# Patient Record
Sex: Male | Born: 1945 | Race: White | Hispanic: No | Marital: Married | Smoking: Former smoker
Health system: Western US, Academic
[De-identification: ages and names within clinical notes are randomized; demographics above are authoritative.]

---

## 2005-12-10 HISTORY — PX: ARTHROPLASTY, KNEE, TOTAL: SHX000058

## 2011-12-11 HISTORY — PX: REPAIR, HERNIA, OPEN: SHX001552

## 2012-12-10 HISTORY — PX: GASTRECTOMY, SLEEVE, LAPAROSCOPIC: SHX000773

## 2014-10-18 ENCOUNTER — Ambulatory Visit: Admission: RE | Admit: 2014-10-18 | Payer: Self-pay | Source: Ambulatory Visit

## 2014-11-02 ENCOUNTER — Inpatient Hospital Stay: Admit: 2014-11-02 | Discharge: 2014-11-02 | Disposition: A | Discharge status: Home / Self Care | Payer: Self-pay

## 2014-11-08 ENCOUNTER — Telehealth: Payer: Self-pay

## 2014-11-08 ENCOUNTER — Encounter: Payer: Self-pay | Admitting: Surgical Oncology

## 2014-11-08 NOTE — Telephone Encounter (Signed)
-----   Message from Dannette Barbara, MD sent at 11/08/2014 12:57 PM PST -----  Regarding: RE: patient you want in 12/7 but I need to know what time  12/7 at 3 pm, ok to overbook.    Thank you very much.      Crist Infante, MD  Associate Professor  Surgical Oncology    ----- Message -----     From: Carmelia Bake     Sent: 11/08/2014  11:05 AM       To: Dannette Barbara, MD  Subject: patient you want in 12/7 but I need to know #    Hi Dr. [name]     This is the patient doing scan 12/5 that you want in 12/7, Leiomyosarcoma L lower extremity.  (see Outlook messaging)    Your clinic is full 12/7, what time do you want to schedule him in?    Lennette Bihari

## 2014-11-10 ENCOUNTER — Encounter: Payer: Self-pay | Admitting: Surgical Oncology

## 2014-11-11 ENCOUNTER — Other Ambulatory Visit: Payer: Self-pay

## 2014-11-11 DIAGNOSIS — E079 Disorder of thyroid, unspecified: Secondary | ICD-10-CM | POA: Insufficient documentation

## 2014-11-11 DIAGNOSIS — Z9884 Bariatric surgery status: Secondary | ICD-10-CM | POA: Insufficient documentation

## 2014-11-11 DIAGNOSIS — Z809 Family history of malignant neoplasm, unspecified: Secondary | ICD-10-CM | POA: Insufficient documentation

## 2014-11-11 DIAGNOSIS — D492 Neoplasm of unspecified behavior of bone, soft tissue, and skin: Secondary | ICD-10-CM | POA: Insufficient documentation

## 2014-11-11 DIAGNOSIS — Z86711 Personal history of pulmonary embolism: Secondary | ICD-10-CM | POA: Insufficient documentation

## 2014-11-11 DIAGNOSIS — E118 Type 2 diabetes mellitus with unspecified complications: Secondary | ICD-10-CM | POA: Insufficient documentation

## 2014-11-11 DIAGNOSIS — Z96659 Presence of unspecified artificial knee joint: Secondary | ICD-10-CM | POA: Insufficient documentation

## 2014-11-11 DIAGNOSIS — Z9889 Other specified postprocedural states: Secondary | ICD-10-CM | POA: Insufficient documentation

## 2014-11-11 DIAGNOSIS — C499 Malignant neoplasm of connective and soft tissue, unspecified: Secondary | ICD-10-CM

## 2014-11-11 DIAGNOSIS — Z87448 Personal history of other diseases of urinary system: Secondary | ICD-10-CM | POA: Insufficient documentation

## 2014-11-11 DIAGNOSIS — Z8719 Personal history of other diseases of the digestive system: Secondary | ICD-10-CM | POA: Insufficient documentation

## 2014-11-11 DIAGNOSIS — R2240 Localized swelling, mass and lump, unspecified lower limb: Secondary | ICD-10-CM | POA: Insufficient documentation

## 2014-11-11 DIAGNOSIS — E782 Mixed hyperlipidemia: Secondary | ICD-10-CM | POA: Insufficient documentation

## 2014-11-11 DIAGNOSIS — R2242 Localized swelling, mass and lump, left lower limb: Secondary | ICD-10-CM

## 2014-11-11 NOTE — Progress Notes (Signed)
Order signed. Thank you very much.      Dayn Wade Sigala, MD  Associate Professor  Surgical Oncology

## 2014-11-13 ENCOUNTER — Inpatient Hospital Stay: Admit: 2014-11-13 | Discharge: 2014-11-13 | Disposition: A | Discharge status: Home / Self Care | Payer: Self-pay

## 2014-11-15 ENCOUNTER — Ambulatory Visit: Payer: Medicare Other

## 2014-11-15 ENCOUNTER — Other Ambulatory Visit: Payer: Self-pay

## 2014-11-15 VITALS — BP 145/80 | HR 70 | Temp 98.2°F | Resp 16 | Ht 68.5 in | Wt 288.0 lb

## 2014-11-15 DIAGNOSIS — Z9889 Other specified postprocedural states: Principal | ICD-10-CM | POA: Insufficient documentation

## 2014-11-15 DIAGNOSIS — M1990 Unspecified osteoarthritis, unspecified site: Secondary | ICD-10-CM

## 2014-11-15 DIAGNOSIS — C4922 Malignant neoplasm of connective and soft tissue of left lower limb, including hip: Secondary | ICD-10-CM | POA: Insufficient documentation

## 2014-11-15 DIAGNOSIS — C499 Malignant neoplasm of connective and soft tissue, unspecified: Secondary | ICD-10-CM

## 2014-11-15 LAB — CBC WITH MANUAL DIFFERENTIAL
BANDS %: 1 %
BASOPHILS %: 2 %
BASOPHILS ABS: 0.12 10*3/uL (ref 0.0–0.5)
EOSINOPHILS %: 2 %
EOSINOPHILS ABS: 0.12 10*3/uL (ref 0.1–0.3)
HEMATOCRIT: 36.8 % — AB (ref 41–53)
HEMOGLOBIN: 12.5 g/dL — AB (ref 13.5–17.5)
LYMPHOCYTES ABS: 2.23 10*3/uL (ref 1.0–4.8)
LYMPHS %: 36 %
MCH: 31.5 pg (ref 27–33)
MCHC: 33.9 % (ref 32–36)
MCV: 92.8 UM3 (ref 80–100)
MONOCYTES %: 5 %
MONOCYTES ABS: 0.31 10*3/uL (ref 0.2–0.4)
MPV: 6.7 UM3 — AB (ref 6.8–10.0)
NEUTROPHIL ABS: 3.41 10*3/uL (ref 1.80–7.70)
PLATELET COUNT: 240 10*3/uL (ref 130–400)
PLATELET ESTIMATE, SMEAR: NORMAL
POLYS (SEGS)%: 54 %
RDW: 14 U (ref 0–14.7)
RED CELL COUNT: 3.97 10*6/uL — AB (ref 4.5–5.9)
SAMPLE SIZE, WBC: 100
WHITE BLOOD CELL COUNT: 6.2 10*3/uL (ref 4.5–11.0)

## 2014-11-15 LAB — LACTATE DEHYDROGENASE: LACTATE DEHYDROGENASE: 120 U/L (ref 90–200)

## 2014-11-15 LAB — COMPREHENSIVE METABOLIC PANEL
ALANINE TRANSFERASE (ALT): 25 U/L (ref 6–63)
ALBUMIN: 4.2 g/dL (ref 3.2–4.6)
ALKALINE PHOSPHATASE (ALP): 46 U/L (ref 35–115)
ASPARTATE TRANSAMINASE (AST): 23 U/L (ref 15–43)
BILIRUBIN TOTAL: 0.7 mg/dL (ref 0.3–1.3)
CALCIUM: 9.5 mg/dL (ref 8.6–10.5)
CARBON DIOXIDE TOTAL: 25 meq/L (ref 24–32)
CHLORIDE: 108 meq/L (ref 95–110)
CREATININE BLOOD: 0.85 mg/dL (ref 0.44–1.27)
GLUCOSE: 94 mg/dL (ref 70–99)
POTASSIUM: 4.3 meq/L (ref 3.3–5.0)
PROTEIN: 6.8 g/dL (ref 6.3–8.3)
SODIUM: 141 meq/L (ref 135–145)
UREA NITROGEN, BLOOD (BUN): 13 mg/dL (ref 8–22)

## 2014-11-15 LAB — INR: INR: 0.94 (ref 0.87–1.18)

## 2014-11-15 LAB — C-REACTIVE PROTEIN

## 2014-11-15 NOTE — Nursing Note (Signed)
Vitals signs taken. Screen for pain. Pharmacy and allergies verified. Alveena Taira, MA

## 2014-11-16 ENCOUNTER — Encounter: Payer: Self-pay | Admitting: Surgical Oncology

## 2014-11-16 LAB — TYPE AND SCREEN
ANTIBODY SCREEN (ORTHO GEL): NEGATIVE
PATIENT BLOOD TYPE: O NEG

## 2014-11-16 NOTE — Progress Notes (Signed)
SURGICAL ONCOLOGY CLINIC NOTE    Dear Dr. 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    The patient is a delightful 43yr male with a history of a soft tissue tumor in the left lateral aspect of his knee for several years.The patient recently became concerned about interval tumor growth.The patient began to experience symptoms related to the mass effect.These symptoms included pain with contact. The patient presented to his physicians for further evaluation. The indeterminate nature of the soft tissue tumor was confirmed. An ultrasound confirmed a solid mass. A percutaneous biopsy was performed. This was interpreted as leiomyosarcoma. I am seeing the patient for additional treatment recommendations.    Clinically, the patient has symptoms as noted above. The patient is otherwise essentially asymptomatic.The patient denies fevers, chills or sweats.The patient has a history significant for weight loss following bariatric surgery, but his recent medical history does not include unintentional weight loss.The patient deniesother soft tissue masses on her body.There is no history ofexposure to radiation. The patient denies a significant history of trauma to the region. The patient has a significant history of degenerative joint disease, including right knee knee replacement, but his left knee is relatively asymptomatic.    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    History of pulmonary embolism    History of renal failure    Knee joint replacement status    Soft tissue tumor    History of gastric  restrictive surgery    H/O umbilical hernia repair    Family history of cancer    Thyroid dysfunction    Mixed hyperlipidemia    Type 2 diabetes mellitus with complication    History of knee surgery    Arthritis, degenerative     PAST SURGICAL HISTORY:   Past Surgical History   Procedure Laterality Date    Repair hernia (open)  2013    Gastrectomy sleeve laparoscopic  2014    Arthroplasty knee total Right 2007     MEDICATIONS: aspirin, B12 (post bariatric surgery), synthroid, and prilosec.    He was informed that the standard recommendation is to hold aspirin and other NSAIDs for 7 - 10 days before a surgical procedure because of the increased risk of bleeding when taking this medication.     ALLERGIES:No Known Allergies    SOCIAL HISTORY:   History     Social History    Marital Status: N/A     Spouse Name: N/A     Number of Children: N/A    Years of Education: N/A     Social History Main Topics    Smoking status: Former Smoker -- 1.00 packs/day for 10 years     Types: Cigarettes    Smokeless tobacco: Never Used      Comment: approx 10 pack years, quit 30 years ago    Alcohol Use: 0.0 - 0.6 oz/week     0-1 Not specified per week      Comment: occasional    Drug Use: Not on file    Sexual Activity: Not on file  Other Topics Concern    Not on file     Social History Narrative     FAMILY HISTORY:  Family History   Problem Relation Age of Onset    throat cancer [Other] [OTHER] Father      9s, died of disease     REVIEW OF SYSTEMS: A detailed review of systems was performed.   Constitutional is negative for fevers, chills or sweats. Weight loss x 110 lbs 2/2 bariatric surgery. Recent weight stable to increased.   GI is negative for nausea or vomiting or abdominal pain.  Heme is negative for anemia.  Cardiac is negative for chest pain.  Respiratory is negative for shortness of breath.  GU is positive nocturia. Negative for dysuria or UTI.  Musculoskeletal is as per HPI.   Neurologic is  negative for headaches and dizziness.   Endocrine is prior history of diabetes, off medications since bariatric operation.  Derm is negative forrash.   Psych is negative for depression and anxiety.    On examination, he is no acute distress.   Temp: 36.8 C (98.2 F) (12/07 1429)  Temp src: Oral (12/07 1429)  Pulse: 70 (12/07 1429)  BP: 145/80 mmHg (12/07 1429)  Resp: 16 (12/07 1429)  SpO2: 97 % (12/07 1429)  Height: 174 cm (5' 8.5") (12/07 1429)  Weight: 130.636 kg (288 lb) (12/07 1429)  Body mass index is 43.15 kg/(m^2).  Head/Face negative icterus, mucous membranes moist.  Neck negative adenopathy, trachea midline.  Chest breasts mild gynecomastia, negative masses. Axilla no adenopathy bilaterally.  Abdomen obese with large pannus inferior abdomen and groins. Healed post-surgical changes. No masses. No tenderness to palpation.  Groins large pannus, but negative masses, negative adenopathy.  Extremities warm without edema. He moves all 4 extremities with good range of motion.  Back negative masses, negative rashes.  Neuro non-focal.  Psych alert and oriented x 3.    A more detailed examination of the left lower extremity was performed. DP pulse is 2+. Plantar/dorsi flexion intact with full range of motion and intact strength. In the left lateral knee subcutaneous tissue, a palpable mass is present. Clinically, it measures approximately 5 cm CC x 3 - 4 cc ML. The mass is firm, but relatively mobile to the deeper tissues. There is mass effect on the skin with teleangiectatic changes and lack of skin mobility relative to the mass. There is no ulceration.    DATA REVIEW: 1) MRI with gadolinium was performed on 11/13/14. I personally reviewed images. A 4 x 3 cm T1 hypointense soft tissue tumor is identified in the superficial tissues of the lateral knee overlying the fascia. The mass is T2 hyperintense with heterogeneous components, and the mass is uniformly enhancing following gadolinium administration.    2)  ultrasound-guided biopsy was performed on 10/21/14. Outside pathology results are consistent with leiomyosarcoma. Pathology slides have been requested at [facility] for review here.    3) CT chest was performed on 11/02/14. I personally reviewed images. There is no evidence of metastases. Chronic 2 - 3 mm pulmonary nodules are present x approximately 3 years which are consistent with benign nodules.    IMPRESSION AND RECOMMENDATIONS:I had a long discussion with the patient and his wife regarding my impression and recommendations.I reviewed the clinical, radiographic, and pathologic findings.Iindicated he is alive with disease.The patient's KPS is approximately 100%.    I indicated that the clinical and cross-sectional imaging findings are consistent with localized leiomyosarcoma of the subcutaneous space. I discussed in the  abstract the multimodality nature of the treatment ofsoft tissue sarcoma.I indicated that complete resection with negative margins is the cornerstone of therapy. I indicated that radiotherapy is frequently administered as part of a combined modality therapy approach, although for some low grade soft tissue sarcoma that are amenable to wide resection, surgical resection may be definitive management as monotherapy. However, this would depend on the absence of possible metastatic disease and establishing a definitive diagnosis. I indicated that in the setting of a known diagnosis of soft tissue sarcoma, complete staging studies are indicated to rule out metastatic disease. In the setting of metastases, then the dominant treatment modality is systemic therapy, and surgical resection is frequently contraindicated.    I indicated that the predominant site of metastases with extremity soft tissue sarcoma is pulmonary metastases via hematogenous dissemination. The negative Chest CT is therefore reassuring that we are dealing with localized disease. I indicated that for localized disease, surgical  resection is potentially curative in nature. In fact, surgical resection is the only potentially curative therapy, and complete resection is clearly associated with superior oncologic outcome.    I described the rationale for wide excision. For known malignant softtissue masses such as sarcomas, a more radical resection is performed, including removal of a margin of normal tissue to ensure a widely clearresection.This is intended to maximize oncologic outcome but does increase the potential for post-surgical morbidity and may impact the long term functional results.     I discussed the risks, benefits, and some of the technical aspects of radical resection/ wide excision.I indicated that the procedure is associated with superior oncologic outcome. It is the only potentially curative approach.I discussed the potential complications of the procedure.These include bleeding, infection, and need for further surgery.There is a remote risk of damage toadjacent structures such as the neurovascular bundle.Although rare,these possible complications could have substantial acute and chronic morbidity in theunlikely event that they should occur.    I indicated that given the superficial nature of the leiomyosarcoma with changes consistent with possible skin involvement, that the surgical approach will include a skin paddle overlying the tumor as a margin. This will maximize oncologic outcome, but may lead to an indication for skin graft for soft tissue reconstruction. We will attempt to mobilize skin flaps for primary closure, but if there are concerns regarding possible tension on the flaps and/or poor wound healing, a skin graft may be indicated to maximize healing and definitive closure of the wound. I explained the risk of hematoma, seroma, or abscess after the operation. I discussed with him the risks of skin necrosis, wound breakdown, and the remote risk of damage to adjacent structures. I indicated that  additional treatment recommendations will depend on the final pathology.    We will proceed with routine preoperative testing, including blood work. His CT chest is negative, so a chest xray is not indicated.He will return for a formal preoperative visit and signing of the informed consent.We have set a tentative date for the operation in the next several weeks. As noted above, he was informed that the standard recommendation is to hold aspirin and other NSAIDs for 7 - 10 days before a surgical procedure because of the increased risk of bleeding when taking this medication.     The patient is comfortable with these recommendations.He is eager to proceed. He knows to contact my office should additionalquestions arise.    Once again, thank you for the kind referral of this delightful patient. Please do not hesitate to contact  me yourself should yourequire additional information.      Crist Infante, MD  Associate Professor  Surgical Oncology

## 2014-11-17 ENCOUNTER — Ambulatory Visit: Payer: Medicare Other

## 2014-11-17 DIAGNOSIS — C4922 Malignant neoplasm of connective and soft tissue of left lower limb, including hip: Secondary | ICD-10-CM

## 2014-11-17 DIAGNOSIS — C499 Malignant neoplasm of connective and soft tissue, unspecified: Principal | ICD-10-CM

## 2014-11-18 ENCOUNTER — Encounter: Payer: Self-pay | Admitting: Surgical Oncology

## 2014-11-18 LAB — INTERLEUKIN 6

## 2014-11-18 LAB — TUMOR NECROSIS FACTOR-ALPHA

## 2014-11-21 ENCOUNTER — Telehealth: Payer: Self-pay

## 2014-11-21 NOTE — Communication Body (Signed)
SURGICAL ONCOLOGY CLINIC NOTE    Dear Dr. 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    The patient is a delightful 74yr male with a history of a soft tissue tumor in the left lateral aspect of his knee for several years.The patient recently became concerned about interval tumor growth.The patient began to experience symptoms related to the mass effect.These symptoms included pain with contact. The patient presented to his physicians for further evaluation. The indeterminate nature of the soft tissue tumor was confirmed. An ultrasound confirmed a solid mass. A percutaneous biopsy was performed. This was interpreted as leiomyosarcoma. I am seeing the patient for additional treatment recommendations.    Clinically, the patient has symptoms as noted above. The patient is otherwise essentially asymptomatic.The patient denies fevers, chills or sweats.The patient has a history significant for weight loss following bariatric surgery, but his recent medical history does not include unintentional weight loss.The patient deniesother soft tissue masses on her body.There is no history ofexposure to radiation. The patient denies a significant history of trauma to the region. The patient has a significant history of degenerative joint disease, including right knee knee replacement, but his left knee is relatively asymptomatic.    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    History of pulmonary embolism    History of renal failure    Knee joint replacement status    Soft tissue tumor    History of gastric  restrictive surgery    H/O umbilical hernia repair    Family history of cancer    Thyroid dysfunction    Mixed hyperlipidemia    Type 2 diabetes mellitus with complication    History of knee surgery    Arthritis, degenerative     PAST SURGICAL HISTORY:   Past Surgical History   Procedure Laterality Date    Repair hernia (open)  2013    Gastrectomy sleeve laparoscopic  2014    Arthroplasty knee total Right 2007     MEDICATIONS: aspirin, B12 (post bariatric surgery), synthroid, and prilosec.    He was informed that the standard recommendation is to hold aspirin and other NSAIDs for 7 - 10 days before a surgical procedure because of the increased risk of bleeding when taking this medication.     ALLERGIES:No Known Allergies    SOCIAL HISTORY:   History     Social History    Marital Status: N/A     Spouse Name: N/A     Number of Children: N/A    Years of Education: N/A     Social History Main Topics    Smoking status: Former Smoker -- 1.00 packs/day for 10 years     Types: Cigarettes    Smokeless tobacco: Never Used      Comment: approx 10 pack years, quit 30 years ago    Alcohol Use: 0.0 - 0.6 oz/week     0-1 Not specified per week      Comment: occasional    Drug Use: Not on file    Sexual Activity: Not on file  Other Topics Concern    Not on file     Social History Narrative     FAMILY HISTORY:  Family History   Problem Relation Age of Onset    throat cancer [Other] [OTHER] Father      54s, died of disease     REVIEW OF SYSTEMS: A detailed review of systems was performed.   Constitutional is negative for fevers, chills or sweats. Weight loss x 110 lbs 2/2 bariatric surgery. Recent weight stable to increased.   GI is negative for nausea or vomiting or abdominal pain.  Heme is negative for anemia.  Cardiac is negative for chest pain.  Respiratory is negative for shortness of breath.  GU is positive nocturia. Negative for dysuria or UTI.  Musculoskeletal is as per HPI.   Neurologic is  negative for headaches and dizziness.   Endocrine is prior history of diabetes, off medications since bariatric operation.  Derm is negative forrash.   Psych is negative for depression and anxiety.    On examination, he is no acute distress.   Temp: 36.8 C (98.2 F) (12/07 1429)  Temp src: Oral (12/07 1429)  Pulse: 70 (12/07 1429)  BP: 145/80 mmHg (12/07 1429)  Resp: 16 (12/07 1429)  SpO2: 97 % (12/07 1429)  Height: 174 cm (5' 8.5") (12/07 1429)  Weight: 130.636 kg (288 lb) (12/07 1429)  Body mass index is 43.15 kg/(m^2).  Head/Face negative icterus, mucous membranes moist.  Neck negative adenopathy, trachea midline.  Chest breasts mild gynecomastia, negative masses. Axilla no adenopathy bilaterally.  Abdomen obese with large pannus inferior abdomen and groins. Healed post-surgical changes. No masses. No tenderness to palpation.  Groins large pannus, but negative masses, negative adenopathy.  Extremities warm without edema. He moves all 4 extremities with good range of motion.  Back negative masses, negative rashes.  Neuro non-focal.  Psych alert and oriented x 3.    A more detailed examination of the left lower extremity was performed. DP pulse is 2+. Plantar/dorsi flexion intact with full range of motion and intact strength. In the left lateral knee subcutaneous tissue, a palpable mass is present. Clinically, it measures approximately 5 cm CC x 3 - 4 cc ML. The mass is firm, but relatively mobile to the deeper tissues. There is mass effect on the skin with teleangiectatic changes and lack of skin mobility relative to the mass. There is no ulceration.    DATA REVIEW: 1) MRI with gadolinium was performed on 11/13/14. I personally reviewed images. A 4 x 3 cm T1 hypointense soft tissue tumor is identified in the superficial tissues of the lateral knee overlying the fascia. The mass is T2 hyperintense with heterogeneous components, and the mass is uniformly enhancing following gadolinium administration.    2)  ultrasound-guided biopsy was performed on 10/21/14. Outside pathology results are consistent with leiomyosarcoma. Pathology slides have been requested at [facility] for review here.    3) CT chest was performed on 11/02/14. I personally reviewed images. There is no evidence of metastases. Chronic 2 - 3 mm pulmonary nodules are present x approximately 3 years which are consistent with benign nodules.    IMPRESSION AND RECOMMENDATIONS:I had a long discussion with the patient and his wife regarding my impression and recommendations.I reviewed the clinical, radiographic, and pathologic findings.Iindicated he is alive with disease.The patient's KPS is approximately 100%.    I indicated that the clinical and cross-sectional imaging findings are consistent with localized leiomyosarcoma of the subcutaneous space. I discussed in the  abstract the multimodality nature of the treatment ofsoft tissue sarcoma.I indicated that complete resection with negative margins is the cornerstone of therapy. I indicated that radiotherapy is frequently administered as part of a combined modality therapy approach, although for some low grade soft tissue sarcoma that are amenable to wide resection, surgical resection may be definitive management as monotherapy. However, this would depend on the absence of possible metastatic disease and establishing a definitive diagnosis. I indicated that in the setting of a known diagnosis of soft tissue sarcoma, complete staging studies are indicated to rule out metastatic disease. In the setting of metastases, then the dominant treatment modality is systemic therapy, and surgical resection is frequently contraindicated.    I indicated that the predominant site of metastases with extremity soft tissue sarcoma is pulmonary metastases via hematogenous dissemination. The negative Chest CT is therefore reassuring that we are dealing with localized disease. I indicated that for localized disease, surgical  resection is potentially curative in nature. In fact, surgical resection is the only potentially curative therapy, and complete resection is clearly associated with superior oncologic outcome.    I described the rationale for wide excision. For known malignant softtissue masses such as sarcomas, a more radical resection is performed, including removal of a margin of normal tissue to ensure a widely clearresection.This is intended to maximize oncologic outcome but does increase the potential for post-surgical morbidity and may impact the long term functional results.     I discussed the risks, benefits, and some of the technical aspects of radical resection/ wide excision.I indicated that the procedure is associated with superior oncologic outcome. It is the only potentially curative approach.I discussed the potential complications of the procedure.These include bleeding, infection, and need for further surgery.There is a remote risk of damage toadjacent structures such as the neurovascular bundle.Although rare,these possible complications could have substantial acute and chronic morbidity in theunlikely event that they should occur.    I indicated that given the superficial nature of the leiomyosarcoma with changes consistent with possible skin involvement, that the surgical approach will include a skin paddle overlying the tumor as a margin. This will maximize oncologic outcome, but may lead to an indication for skin graft for soft tissue reconstruction. We will attempt to mobilize skin flaps for primary closure, but if there are concerns regarding possible tension on the flaps and/or poor wound healing, a skin graft may be indicated to maximize healing and definitive closure of the wound. I explained the risk of hematoma, seroma, or abscess after the operation. I discussed with him the risks of skin necrosis, wound breakdown, and the remote risk of damage to adjacent structures. I indicated that  additional treatment recommendations will depend on the final pathology.    We will proceed with routine preoperative testing, including blood work. His CT chest is negative, so a chest xray is not indicated.He will return for a formal preoperative visit and signing of the informed consent.We have set a tentative date for the operation in the next several weeks. As noted above, he was informed that the standard recommendation is to hold aspirin and other NSAIDs for 7 - 10 days before a surgical procedure because of the increased risk of bleeding when taking this medication.     The patient is comfortable with these recommendations.He is eager to proceed. He knows to contact my office should additionalquestions arise.    Once again, thank you for the kind referral of this delightful patient. Please do not hesitate to contact  me yourself should yourequire additional information.      Crist Infante, MD  Associate Professor  Surgical Oncology

## 2014-11-21 NOTE — Telephone Encounter (Signed)
-----   Message from Dannette Barbara, MD sent at 11/21/2014  5:49 PM PST -----  Regarding: outside slides  Hi Zoe,    Any update on outside slides for this patient? I have not seen anything in the EMR. Thank you very much.    Mikki Santee

## 2014-11-22 LAB — OUTSIDE PATHOLOGY FOR INTERPRETATION

## 2014-11-25 NOTE — Pre-Op/Pre-Procedure Screening (Addendum)
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    PSH:  Past Surgical History   Procedure Laterality Date    Repair hernia (open)  2013    Gastrectomy sleeve laparoscopic  2014    Arthroplasty knee total Right 2007       Anesthetic Hx:  Denies anesthesia complications. Denies family history of anesthesia complications. Denies history of malignant hyperthermia.    Allergies: No Known Allergies    Medications:   No outpatient prescriptions have been marked as taking for the 12/08/14 encounter [facility] Encounter).          Drug/Alcohol Hx:  History   Smoking status    Former Smoker -- 1.00 packs/day for 10 years    Types: Cigarettes   Smokeless tobacco    Never Used     Comment: approx 10 pack years, quit 30 years ago     History   Alcohol Use    0.0 - 0.6 oz/week    0-1 Not specified per week     Comment: occasional     History   Drug Use Not on file       CV Tests:  N/A    Labs:   CBC:     Lab Results  Component Value Date/Time   WHITE BLOOD  CELL COUNT 6.2 11/15/2014 1542   RED CELL COUNT 3.97* 11/15/2014 1542   HEMOGLOBIN 12.5* 11/15/2014 1542   HEMATOCRIT 36.8* 11/15/2014 1542   MCV 92.8 11/15/2014 1542   MCH 31.5 11/15/2014 1542   RDW 14.0 11/15/2014 1542   PLATELET COUNT 240 11/15/2014 1542   PLATELET ESTIMATE, SMEAR NORMAL 11/15/2014 1542       BMP/CMP:    Lab Results  Component Value Date/Time   SODIUM 141 11/15/2014 1542   POTASSIUM 4.3 11/15/2014 1542   CHLORIDE 108 11/15/2014 1542   CARBON DIOXIDE TOTAL 25 11/15/2014 1542   CREATININE BLOOD 0.85 11/15/2014 1542   E-GFR, AFRICAN AMERICAN >60 11/15/2014 1542   E-GFR, NON-AFRICAN AMERICAN >60 11/15/2014 1542   UREA NITROGEN, BLOOD (BUN) 13 11/15/2014 1542   GLUCOSE 94 11/15/2014 1542   CALCIUM 9.5 11/15/2014 1542       Lab Results  Component Value Date/Time   PROTEIN 6.8 11/15/2014 1542   ALBUMIN 4.2 11/15/2014 1542   ALKALINE PHOSPHATASE (ALP) 46 11/15/2014 1542   ASPARTATE TRANSAMINASE (AST) 23 11/15/2014 1542   BILIRUBIN TOTAL 0.7  11/15/2014 1542   ALANINE TRANSFERASE (ALT) 25 11/15/2014 1542       HgBA1C:   No results found for: HGBA1C    Coag Panel:    Lab Results  Component Value Date/Time   INR 0.94 11/15/2014 1542       Thyroid Panel:  No results found for: TSH, FT4, FT3    Type and Screen:     Lab Results  Component Value Date/Time   PATIENT BLOOD TYPE O NEGATIVE 11/15/2014 1542       ROS:  CV:  Denies MI. Denies any chest pain/pressure, palpitations, DOE, PND, orthopnea, dizziness or edema.  Pt. is able to lie flat without difficulty breathing.   Resp: Denies pulmonary disease, asthma, sleep apnea, URI or history of pneumonia  Neuro/Psych:  Denies CVA, TIA, seizures, neurologic disease, or psychiatric illness  Musculoskeletal:  Denies muscle, bone or connective tissue disease. Right knee replacement.   Med:  GERD. Denies  liver and kidney,. Denies blood dyscrasia/ coagulopathy. Denies recent antibiotic use, hospitalization or ED visit.  Agrees to blood transfusion if needed.      Activity: Walks for 2 blocks and does heavy gardening.  He also cares for his wife who has had a major stroke.    PE: (per patient report)  Airway:  Denies jaw/TMJ problems.  Denies loose, missing or broken teeth. Edentulous.  Neck:  Denies pain in the neck/radicular symptoms with extension and flexion.    Vitals:  Temp: 36.8 C (98.2 F) (11/15/2014  2:29 PM)  Temp src: Oral (11/15/2014  2:29 PM)  Pulse: 70 (11/15/2014  2:29 PM)  BP: 145/80 mmHg (11/15/2014  2:29 PM)  Resp: 16 (11/15/2014  2:29 PM)  SpO2: 97 % (room air ) (11/15/2014  2:29 PM)  Height: 1.74 m (5' 8.5") (11/15/2014  2:29 PM)  Weight: 130.636 kg (288 lb) (11/15/2014  2:29 PM)      Pt. Instructions: He has been instructed by Dr. [name] to hold aspirin and other NSAIDs for 7 - 10 days.  NPO instructions reviewed.     [name], RN      Pre-Op Phone Call Documentation    Verification of planned surgery times  Surgery Time Verified: Yes  Arrival Time Verified: [facility] 12/08/2014)  Surgery Location Verified[facility]    Verification of NPO Status  Verified NPO Clear Liquids Time: 0615  Verified NPO Clear Liquids Date: 12/08/14  Verified NPO Solids Time: 0000  Verified NPO Solids Date: 12/08/14     Post surgical discharge information   Person to pick up pt: Odette Horns (friend)    Sherie Don, RN  12/28/201514:27

## 2014-12-06 ENCOUNTER — Ambulatory Visit: Payer: Medicare Other

## 2014-12-06 VITALS — BP 127/76 | HR 66 | Temp 98.2°F | Resp 18 | Ht 67.5 in | Wt 285.8 lb

## 2014-12-06 DIAGNOSIS — I444 Left anterior fascicular block: Secondary | ICD-10-CM

## 2014-12-06 DIAGNOSIS — R9431 Abnormal electrocardiogram [ECG] [EKG]: Secondary | ICD-10-CM

## 2014-12-06 DIAGNOSIS — C4922 Malignant neoplasm of connective and soft tissue of left lower limb, including hip: Secondary | ICD-10-CM | POA: Insufficient documentation

## 2014-12-06 DIAGNOSIS — C499 Malignant neoplasm of connective and soft tissue, unspecified: Secondary | ICD-10-CM | POA: Insufficient documentation

## 2014-12-06 DIAGNOSIS — Z01818 Encounter for other preprocedural examination: Principal | ICD-10-CM | POA: Insufficient documentation

## 2014-12-06 DIAGNOSIS — I4510 Unspecified right bundle-branch block: Secondary | ICD-10-CM

## 2014-12-06 NOTE — Progress Notes (Signed)
[facility]    Teaching Documentation for: Pre-op         MOTIVATIONAL LEVEL:         Asks questions/seeks information: yes    Seems interested / passive listening: yes    Denies need: n/a     Uncooperative: n/a        LEARNING NEEDS/BEST LEARNING METHODS:    1. What information does the patient / family state they need?     Information needed: none        2. By what method does the patient / family learn best?    Verbal Instruction: English    Reading: English        POTENTIAL PHYSICAL BARRIERS TO LEARNING:    Vision/Hearing/Speech    No Problems: yes    Visually Impaired: no    Hearing Impaired: no    Speech Normal: yes    Comments: none        POTENTIAL BARRIERS TO LEARNING:    Anxiety/stress: no    Pain/nausea: no    Fatigue/weakness: no    Culture/Religion: no    Comments: none        Pt instructed, and verbalized understanding of the following:     Identify planned surgical procedures    State rationale for sequence of events that occur during pre-op visits    Describe pre-op preparation protocol i.e. bowel prep, contact from surgery nurse    Identify possible post-op events        Pt given written materials:    What to Expect Before and After Surgery    Signs of infection         Advice nurse contact information    A Patient's Guide to Blood Transfusion    Managing Pain, jp drain management, diet and exercise after surgery.  Clarisa Schools, RN

## 2014-12-06 NOTE — Nursing Note (Signed)
Vital signs taken, allergies verified,screened for pain     Teaghan Formica MA II

## 2014-12-06 NOTE — Progress Notes (Signed)
PRE-OPERATIVE HISTORY & PHYSICAL  SURGICAL ONCOLOGY    Chief Complaint: Left lateral knee leiomyosarcoma    HPI: 68yr old male with a history of a soft tissue tumor in the left lateral aspect of his knee for several years. Recently, Louis Taylor became concerned about interval tumor growth as he began to experience symptoms related to the mass effect. These symptoms include pain with contact - he denies any issues with ambulation but reports that if he hits the mass accidentally against something he will have sharp, short lasting pain (much like a pinch). An ultrasound confirmed a solid mass, and a percutaneous biopsy confirmed leiomyosarcoma.     Since the patient was last evaluated by Dr. [name], his symptoms, noted above, have not changed. The patient continues to be otherwise essentially asymptomatic. He denies fevers, chills or night sweats. The patient has a history significant for weight loss following bariatric surgery (sleeve gastrectomy), but his recent medical history does not include unintentional weight loss. He denies other soft tissue masses on his body. There is no history of exposure to radiation. The patient denies a significant history of trauma to the region prior to noticing the mass. The patient has a significant history of degenerative joint disease, including right knee replacement, and he is going to have his left knee replaced after he recovers from this operation.    ROS:  ROS was negative except for mentioned in the HPI. Patient questionaire was reviewed  and confirmed.     Meds:  Current Outpatient Prescriptions on File Prior to Visit   Medication Sig Dispense Refill    ASPIRIN PO Take 81 mg by mouth every day.      aspirin-acetaminophen-caffeine 250-250-65 mg Tablet Take 65 mg by mouth if needed.      ATORVASTATIN CALCIUM (LIPITOR PO)       CALCIUM CARBONATE/VITAMIN D3 (CALCIUM 500 + D, D3, PO) Take 500 mg by mouth every day.      CHOLESTEROL MISC Take 40 mg by mouth.       CYANOCOBALAMIN/FOLIC ACID (VITAMIN L38-BOFBP ACID) 102-585 mcg Tablet       LEVOTHYROXINE SODIUM (SYNTHROID PO) Take 137 mg by mouth every day.      OMEPRAZOLE (PRILOSEC PO) Take 20 mg by mouth every day.      VITAMIN D-3 PO Take 500 mg by mouth every day.       No current facility-administered medications on file prior to visit.     Allergies:  No Known Allergies    History:  I did review patient's past medical and family/social history, no changes noted.  Past Surgical History   Procedure Laterality Date    Repair hernia (open)  2013    Gastrectomy sleeve laparoscopic  2014    Arthroplasty knee total Right 2007     No past medical history on file.    History   Substance Use Topics    Smoking status: Former Smoker -- 1.00 packs/day for 10 years     Types: Cigarettes    Smokeless tobacco: Never Used      Comment: approx 10 pack years, quit 30 years ago    Alcohol Use: 0.0 - 0.6 oz/week     0-1 Not specified per week      Comment: occasional     Exam:  Temp: 36.8 C (98.2 F) (12/28 1059)  Temp src: Oral (12/28 1059)  Pulse: 66 (12/28 1059)  BP: 127/76 mmHg (12/28 1059)  Resp: 18 (12/28 1059)  SpO2:  98 % (12/28 1059)  Height: 171.5 cm (5' 7.5") (12/28 1059)  Weight: 129.638 kg (285 lb 12.8 oz) (12/28 1059)    Physical Exam  Gen: No acute distress, pleasant, cooperative  CV: RRR, no murmurs, rubs or gallops. Normal PMI.  Pulm: Lungs clear to auscultation bilaterally, no rhonchi, wheezes or rales.  Abd: soft, non-tender, non distended. Well healed laparoscopic scars. Positive pannus with clean intact intertriginous areas.  Left lower extremity: Firm mass relatively mobile to the deeper tissues. Some notable mass effect on the skin with teleangiectatic changes and lack of skin mobility relative to the mass. No ulceration. Non-painful to palpation.  Ext/Vasc: Non-edematous extremities, palpable DP/PT, popliteal pulses bilaterally.    ASSESSMENT: 68yr old male with left lower extremity, lateral knee biopsy proven  leiomyosarcoma, here for preoperative evaluation for excision with Dr. [name].    PLAN: The risks of the surgical procedure were explained to the patient today. These include bleeding, infection, pain, scarring, possible need for reoperation, possible damage to surrounding structures, nerves, arteries and veins, which could result in loss of sensation and/or motor function. There is also a risk that the patient will require a split thickness skin graft if the skin is not easily approximated after the tumor is excised. Though he would like to go home following the procedure, he accepts the risk that he may have to be admitted if a skin graft is placed. There are risks of general anesthesia including deep vein thrombosis, pulmonary embolism, stroke, heart attack and death. The patient accepts these risks and is willing to proceed. Surgical consent was signed today. The patient received pre operative teaching by the registered nurse in the clinic. He will proceed to lab and/or xray for the following tests: EKG. EKG completed here in the office today had notable right bundle branch block lead V2 (precordial) and left anterior fascicular block. There were no other EKGs for comparison, and the patient denies any history of cardiac events, so this EKG will be used as a baseline.  He has already completed the other necessary pre-operative labs/imaging.   The patient had an opportunity to ask questions and all his questions were answered. There were no obvious barriers to learning observed.     The patient will present to the hospital on 12/08/14 for excision of soft tissue tumor on left lower extremity with possible split thickness skin graft with Dr. [name].    Electronically signed by:  Concha Se, MD  General Surgery, PGY-1  Surgical Oncology   Service Pager: [phone]  PI#: [id number]

## 2014-12-06 NOTE — Nursing Note (Signed)
Performed Pre Op EKG in clinic per protocol.    Annice Pih, MA II

## 2014-12-07 LAB — POC ELECTROCARDIOGRAM WITH RHYTHM STRIP: QTC: 483

## 2014-12-08 ENCOUNTER — Telehealth: Payer: Self-pay

## 2014-12-08 ENCOUNTER — Other Ambulatory Visit: Payer: Self-pay

## 2014-12-08 ENCOUNTER — Ambulatory Visit (HOSPITAL_BASED_OUTPATIENT_CLINIC_OR_DEPARTMENT_OTHER): Payer: Medicare Other | Admitting: Anesthesiology

## 2014-12-08 ENCOUNTER — Ambulatory Visit
Admission: RE | Admit: 2014-12-08 | Discharge: 2014-12-08 | DRG: 544 | Disposition: A | Discharge status: Home / Self Care | Payer: Medicare Other | Source: Ambulatory Visit

## 2014-12-08 ENCOUNTER — Encounter: Admission: RE | Disposition: A | Discharge status: Home / Self Care | Payer: Self-pay | Source: Ambulatory Visit

## 2014-12-08 ENCOUNTER — Ambulatory Visit: Payer: Medicare Other | Admitting: Anesthesiology

## 2014-12-08 DIAGNOSIS — Z87891 Personal history of nicotine dependence: Secondary | ICD-10-CM | POA: Insufficient documentation

## 2014-12-08 DIAGNOSIS — D492 Neoplasm of unspecified behavior of bone, soft tissue, and skin: Secondary | ICD-10-CM

## 2014-12-08 DIAGNOSIS — C7652 Malignant neoplasm of left lower limb: Secondary | ICD-10-CM

## 2014-12-08 DIAGNOSIS — C4922 Malignant neoplasm of connective and soft tissue of left lower limb, including hip: Secondary | ICD-10-CM

## 2014-12-08 DIAGNOSIS — C499 Malignant neoplasm of connective and soft tissue, unspecified: Secondary | ICD-10-CM

## 2014-12-08 HISTORY — PX: PR ADJT TIS TRNSFR/REARGMT DEFEC EA ADDL 30 SQCM: 14302

## 2014-12-08 HISTORY — PX: PR ADJNT TIS TRNSFR/REARGMT ANY AREA 30.1-60 SQ CM: 14301

## 2014-12-08 HISTORY — PX: PR RAD RESECTION TUMOR SOFT TIS THIGH/KNEE 5 CM/>: 27364

## 2014-12-08 SURGERY — EXCISION, MASS, THIGH, RADICAL
Anesthesia: General | Site: Lower Extremity | Wound class: Clean

## 2014-12-08 MED ORDER — ONDANSETRON HCL (PF) 4 MG/2 ML INJECTION SOLUTION
4.0000 mg | INTRAMUSCULAR | Status: DC | PRN
Start: 2014-12-08 — End: 2014-12-08

## 2014-12-08 MED ORDER — FENTANYL (PF) 50 MCG/ML INJECTION SOLUTION
25.0000 ug | INTRAMUSCULAR | Status: DC | PRN
Start: 2014-12-08 — End: 2014-12-08

## 2014-12-08 MED ORDER — EPHEDRINE SULFATE 50 MG/ML INJECTION SOLUTION
INTRAMUSCULAR | Status: DC | PRN
Start: 2014-12-08 — End: 2014-12-08
  Administered 2014-12-08 (×2): 10 mg via INTRAVENOUS
  Administered 2014-12-08: 5 mg via INTRAVENOUS

## 2014-12-08 MED ORDER — HYDROCODONE 5 MG-ACETAMINOPHEN 325 MG TABLET
1.0000 | ORAL_TABLET | ORAL | Status: AC | PRN
Start: 2014-12-08 — End: 2015-01-07

## 2014-12-08 MED ORDER — INTRAOP NACL 0.9% 1000 ML IRRIGATION BOTTLE
INTRAMUSCULAR | Status: DC | PRN
Start: 2014-12-08 — End: 2014-12-08
  Administered 2014-12-08: 12:00:00

## 2014-12-08 MED ORDER — CEFAZOLIN IV PUSH ONCALL TO OR
3.0000 g | INTRAMUSCULAR | Status: AC
Start: 2014-12-08 — End: 2014-12-08
  Administered 2014-12-08: 3 g via INTRAVENOUS

## 2014-12-08 MED ORDER — ACETAMINOPHEN 1,000 MG/100 ML (10 MG/ML) INTRAVENOUS SOLUTION
INTRAVENOUS | Status: DC | PRN
Start: 2014-12-08 — End: 2014-12-08
  Administered 2014-12-08: 1000 mg via INTRAVENOUS

## 2014-12-08 MED ORDER — HYDROMORPHONE 1 MG/ML INJECTION SYRINGE
0.2000 mg | INJECTION | INTRAMUSCULAR | Status: DC | PRN
Start: 2014-12-08 — End: 2014-12-08

## 2014-12-08 MED ORDER — CEFAZOLIN IV PUSH ONCALL TO OR
2.0000 g | INTRAMUSCULAR | Status: DC
Start: 2014-12-08 — End: 2014-12-08

## 2014-12-08 MED ORDER — BACITRACIN 500 UNIT/GRAM TOPICAL OINTMENT
TOPICAL_OINTMENT | TOPICAL | Status: DC | PRN
Start: 2014-12-08 — End: 2014-12-08
  Administered 2014-12-08: .5 via TOPICAL

## 2014-12-08 MED ORDER — ONDANSETRON HCL (PF) 4 MG/2 ML INJECTION SOLUTION
INTRAMUSCULAR | Status: DC | PRN
Start: 2014-12-08 — End: 2014-12-08
  Administered 2014-12-08: 4 mg via INTRAVENOUS

## 2014-12-08 MED ORDER — LACTATED RINGERS IV INFUSION
INTRAVENOUS | Status: DC
Start: 2014-12-08 — End: 2014-12-08

## 2014-12-08 MED ORDER — INTRAOP DEXMEDETOMIDINE 100 MCG/ML IV BOLUS DOSE
Status: DC | PRN
Start: 2014-12-08 — End: 2014-12-08
  Administered 2014-12-08 (×2): 8 ug via INTRAVENOUS
  Administered 2014-12-08: 12 ug via INTRAVENOUS

## 2014-12-08 MED ORDER — DOCUSATE SODIUM 100 MG CAPSULE
100.0000 mg | ORAL_CAPSULE | Freq: Two times a day (BID) | ORAL | 11 refills | Status: AC
Start: 2014-12-08 — End: 2015-12-03
  Filled 2014-12-08: qty 60, 30d supply, fill #0

## 2014-12-08 MED ORDER — LIDOCAINE HCL 20 MG/ML (2 %) INJECTION SOLUTION
INTRAMUSCULAR | Status: DC | PRN
Start: 2014-12-08 — End: 2014-12-08
  Administered 2014-12-08: 5 mL via INTRAVENOUS

## 2014-12-08 MED ORDER — LACTATED RINGERS IV INFUSION
INTRAVENOUS | Status: DC
Start: 2014-12-08 — End: 2014-12-08
  Administered 2014-12-08 (×2): via INTRAVENOUS

## 2014-12-08 MED ORDER — INTRAOP BUPIVACAINE PF 0.25% INJ 30 ML VIAL ADDITIVE
Status: DC | PRN
Start: 2014-12-08 — End: 2014-12-08
  Administered 2014-12-08: 30 mL

## 2014-12-08 MED ORDER — HEPARIN, PORCINE (PF) 1,000 UNIT/ML INJECTION SOLUTION
INTRAMUSCULAR | Status: DC | PRN
Start: 2014-12-08 — End: 2014-12-08
  Administered 2014-12-08: 5000 [IU] via SUBCUTANEOUS

## 2014-12-08 MED ORDER — HYDROMORPHONE 1 MG/ML INJECTION SYRINGE
INJECTION | INTRAMUSCULAR | Status: DC | PRN
Start: 2014-12-08 — End: 2014-12-08
  Administered 2014-12-08 (×3): 0.2 mg via INTRAVENOUS

## 2014-12-08 MED ORDER — PHENYLEPHRINE (PF) 1 MG/10 ML (100 MCG/ML) IN 0.9 % NACL IV SYRINGE
INJECTION | INTRAVENOUS | Status: DC | PRN
Start: 2014-12-08 — End: 2014-12-08
  Administered 2014-12-08: 50 ug via INTRAVENOUS
  Administered 2014-12-08 (×5): 100 ug via INTRAVENOUS
  Administered 2014-12-08: 50 ug via INTRAVENOUS
  Administered 2014-12-08 (×4): 100 ug via INTRAVENOUS

## 2014-12-08 MED ORDER — PROPOFOL BOLUS - PEDS
INTRAVENOUS | Status: DC | PRN
Start: 2014-12-08 — End: 2014-12-08
  Administered 2014-12-08: 10 mg via INTRAVENOUS
  Administered 2014-12-08: 20 mg via INTRAVENOUS
  Administered 2014-12-08 (×5): 10 mg via INTRAVENOUS
  Administered 2014-12-08: 200 mg via INTRAVENOUS
  Administered 2014-12-08 (×2): 20 mg via INTRAVENOUS
  Administered 2014-12-08 (×6): 10 mg via INTRAVENOUS
  Administered 2014-12-08: 20 mg via INTRAVENOUS

## 2014-12-08 MED ORDER — FENTANYL (PF) 50 MCG/ML INJECTION SOLUTION
INTRAMUSCULAR | Status: DC | PRN
Start: 2014-12-08 — End: 2014-12-08
  Administered 2014-12-08: 50 ug via INTRAVENOUS
  Administered 2014-12-08: 25 ug via INTRAVENOUS
  Administered 2014-12-08: 50 ug via INTRAVENOUS
  Administered 2014-12-08: 25 ug via INTRAVENOUS
  Administered 2014-12-08: 50 ug via INTRAVENOUS
  Administered 2014-12-08 (×2): 25 ug via INTRAVENOUS

## 2014-12-08 MED ORDER — LIDOCAINE HCL 10 MG/ML (1 %) INJECTION SOLUTION
0.1000 mL | INTRAMUSCULAR | Status: DC | PRN
Start: 2014-12-08 — End: 2014-12-08
  Administered 2014-12-08: 0.25 mL
  Filled 2014-12-08: qty 2

## 2014-12-08 SURGICAL SUPPLY — 45 items
BLADE KNIFE 15 (Blade) ×5 IMPLANT
CAUTERY BOVIE PAD ADULT (Other) ×5 IMPLANT
CLIP HORIZON MEDIUM BLUE 6 CLIPS TELEFLEX WECK (Clip) ×5 IMPLANT
COVER LIGHT HANDLE STERIS (Other) ×15 IMPLANT
DISC USE 152549 - PREP CHLORAPREP 26ML WITH TINT ~~LOC~~ (Prep) ×15 IMPLANT
DISC USE 154758 - TAPE MEDIPORE 4 IN COVER ROLL (Other) ×5 IMPLANT
DRAIN JP 10MM FLAT SILICONE TUBING (Drain) ×10 IMPLANT
DRAIN JP EVACUATOR (0070900) (Drain) ×10 IMPLANT
DRAPE BACK TABLE COVER 44 X 88IN (Drape) ×5 IMPLANT
DRAPE BIAS STOCKINETTE TUBE 6IN (Drape) ×5 IMPLANT
DRAPE MAYO STAND COVER XLARGE LATEX FREE (Drape) ×5 IMPLANT
DRAPE SHEET LARGE 60 X 76 (89121) (Drape) ×5 IMPLANT
DRAPE SHEET ORTHO BAR 72 X 112IN (Drape) ×5 IMPLANT
DRAPE SHEET ORTHO U 88 X 108IN (Drape) ×5 IMPLANT
DRAPE STERI IOBAN 3M 6650 (Drape) ×5 IMPLANT
DRAPE TOWEL WHITE TERRY STERILE (MDT217272R) (Drape) ×5 IMPLANT
DRESSING COBAN 4IN X 5YD LATEX FREE (Dressing) ×5 IMPLANT
DRESSING COBAN 4IN X 5YD LATEX FREE STERILE (Dressing) ×5 IMPLANT
DRESSING DERMABOND ADVANCED TISSUE ADHESIVE (Dressing) ×5 IMPLANT
DRESSING IMMOBILIZER KNEE 28IN (Dressing) ×10 IMPLANT
DRESSING KERLIX ROLL (9322) (Dressing) ×5 IMPLANT
ELECTRODE BLADE INSULATED 2.75IN (Blade) ×10 IMPLANT
GLOVES BIOGEL 6 1/2 TOP GLOVE LATEX (Glove) ×20 IMPLANT
GLOVES BIOGEL 6 TOP GLOVE LATEX (Glove) ×5 IMPLANT
GLOVES BIOGEL 7 TOP GLOVE LATEX (Glove) ×25 IMPLANT
GLOVES BIOGEL INDICATOR 6 1/2 GREEN UNDERGLOVE LATEX (Glove) ×10 IMPLANT
GLOVES BIOGEL INDICATOR 7 1/2 GREEN UNDERGLOVE LATEX (Glove) ×10 IMPLANT
GLOVES BIOGEL INDICATOR 7 GREEN UNDERGLOVE LATEX (Glove) ×15 IMPLANT
GOWN LARGE AERO BLUE AAMI 3 STANDARD (Gown) ×15 IMPLANT
GOWN SLEEVE SMMS 1231-105 (Glove) ×5 IMPLANT
HEADREST DONUT 9IN (M10-090) (Other) ×5 IMPLANT
NEEDLE 25 GAUGE X 1 1/2IN (Needle) ×5 IMPLANT
PACK BASIN LATEX SAFE (DYNJ0191310Q) (Pack) ×5 IMPLANT
PAD EGGCRATE HEEL & ANKLE LF (4815) (Other) ×15 IMPLANT
SCD SLEEVE CALF REG 18IN ALP 1 (Other) ×5 IMPLANT
SPONGE GAUZE 4 X 4IN 12PLY STERILE 10 PACK (Dressing) ×5 IMPLANT
SPONGE LAP TAPES (385618) (Sponge) ×5 IMPLANT
SUTURE ETHILON 2-0 FS 18IN BLACK (Suture) ×30 IMPLANT
SUTURE ETHILON 3-0 FSL 30IN BLACK (Suture) ×10 IMPLANT
SUTURE SILK 3-0 SH 30IN BLACK (Suture) ×5 IMPLANT
SUTURE VICRYL 2-0 SH 27IN UNDYED (Suture) ×10 IMPLANT
SUTURE VICRYL 3-0 SH 18IN CONTROL RELEASE 8 PACK UNDYED (Suture) ×25 IMPLANT
SUTURE VICRYL 3-0 SH 27IN UNDYED (Suture) ×10 IMPLANT
SUTURE VICRYL 3-0 TIES 18IN 12 PACK UNDYED (Suture) ×5 IMPLANT
SYRINGE SAFETY LL 12ML (8881522000) (Syringe) ×5 IMPLANT

## 2014-12-08 NOTE — H&P (Signed)
Patient seen and examined. No interval change in history and physical exam since my last evaluation in [place].    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    Crist Infante, MD  Associate Professor  Surgical Oncology

## 2014-12-08 NOTE — Telephone Encounter (Signed)
Called patient's wife to update her on the operation. I updated her on the intra-operative course of the patient. I informed that no skin graft was necessary, but the incision is quite large in order to create flaps to close wound primarily ( because of skin involvement by tumor). We will monitor him in recovery room to determine if overnight admission is indicated. She verbalized understanding. She was thankful for call.      Crist Infante, MD  Associate Professor  Surgical Oncology

## 2014-12-08 NOTE — Nurse Assessment (Signed)
PACU ADMIT NURSING NOTE    Note Started: 12/08/2014, 14:15     Received patient from OR at 1356 hours via gurney.  Monitor and Alarms on.  Patient sleepy but arousable. Alvester Chou, RN

## 2014-12-08 NOTE — OR Nursing (Signed)
OR To Postop Destination    Patient's level of consciousness: drowsy but responsive    Patient transferred to: PACU  Transported via: gurney  Transported by: anesthesia person and circulator  Head of bed: elevated  Oxygen delivered via: face mask  Monitored enroute: yes, visually    Report given to: PACU RN

## 2014-12-08 NOTE — Nurse Focus (Signed)
PREOP ADMIT NURSING NOTE    Note Started:0900 12/08/2014, 11:37     Received patient from as a direct admit at 0900 hours via ambulation.  Patient status  awake and alert.  Oriented to room and unit.  Admission assessment and care plan initiated.   PIN number cards provided to patient.   Cherlynn June, RN

## 2014-12-08 NOTE — Discharge Instructions (Addendum)
SURGICAL ONCOLOGY DISCHARGE INSTRUCTIONS:  Date: 12/08/2014  Time:  14:12     NAME:  Louis Taylor  AGE: 37yr  SEX: male  MRN: [id number]    Diagnosis: leiomyosarcoma of left lower extremity     Procedure: Excision of leiomyosarcoma     MEDICATIONS  1. Resume all home medications as directed.  2. Start new discharge medications as directed.You have been prescribed a pain with called tylenol and hydrocodone (Norco). You may also take tylenol for pain. Do not exceed 4000 mg of tylenol per day.   3. Do not drive or operate machinery while taking narcotic pain medications.    ACTIVITY   1. No heavy lifting of more than 10 pounds for the next 2 weeks.  2. Wear the knee immobilizer at all times   3. Keep dressings on until your follow up appt  4. No showers. Sponge bath is ok. No baths or submersion in water      DIET  1. Resume same diet prior to your hospital admission.    WOUND  1. Keep dressings on until your follow up visit. No baths or showers.     RETURN  1. Follow-up with Outpatient Surgery Clinic on 12/20/14 8:00 AM. An appointment will be scheduled for you, and you will be contacted with the appointment time. If you have not received a phone call within 2 business days or you need to reschedule please call the clinic: [phone]  2. Call the clinic or go to the Emergency Department near you for any symptoms such as fevers with temperature greater than 101.5 F, chills, persistent nausea and vomiting, severe pain not controlled with medications, pus drainage from the wound site, or for any questions or concerns.     During business hours, call the clinic at [phone] for any concerns. After hours and weekends, call the hospital at [phone] and ask for the Surgical Oncology resident on-call.     General Anesthesia -  After surgery, things you need to know:    1. It is normal to feel dizzy and sleepy for many hours after your surgery.  Do not drive a car, work any equipment, sign important papers, or make big  decisions until the next day.  2. Diet:  Begin with clear liquids (apple juice, clear broth, ginger ale) and progress slowly to what you normally eat.  No alcoholic drinks for 24 hours.  3. Activities:  Rest the day of surgery.  The day after surgery , you may do whatever you feel able to do, unless otherwise directed by your physician.  You should be able to do all of your normal activities within 3-4 days.  4. Temperature:  Report any FEVER over 100 degrees to your doctor.  Take your temperature once the evening of the day of surgery, and once the next morning.      For Anesthesia related problems call [phone] and ask for the Anesthesia resident on call.    For Surgical problems, report to Surgical Oncology at  [phone].  If you are unable to access services, call the [facility] Operator at [phone] and ask for the Physician on call for: Surgical Oncology.

## 2014-12-08 NOTE — Procedures (Signed)
ATTENDING BRIEF OPERATIVE NOTE  Service Surgical Oncology     Date of Service:  12/08/2014, 13:53    Pre-Op Diagnosis:  Leiomyosarcoma left lower extremity     Post-Op Diagnosis:  Same     Procedure Performed/Description:  Radical resection left lower extremity leiomyosarcoma, local tissue transfer 90 cm2    Name of Surgeon and Assistants:  Yanira Tolsma, Chtchetinin    Type of Anesthesia:  General anesthesia     ASA Class: 3     DVT Prophylaxis: SCDs, preoperative heparin     Findings:  Subcutaneous soft tissue sarcoma lateral left knee     Specimens Removed:  Tumor en bloc with margins     EBL:  50 mL    RBCs: negative     Drains:  JP x 2    Fluids:  Per Anesthesia     OR Time: 335 mins     Complications:  None     Outcome: stable     Potential Modifiers: Any of the Following Modifiers Apply To This Case?  -22 Unusual procedural service (must document what made it unusual)  no  -62 Co-Surgery (both surgeons must separately document their portions no  -80 Pensions consultant (faculty assist - non Medicare patient) no  -Quarry manager (faculty assist- Medicare patient, no qualified residents available) no  Are there any other factors about the case that are important for correct coding?  no    Operating Room Procedure Presence: I was physically present for the entire procedure.    The information contained on this form is true and accurate to the best of my knowledge.  Further, I understand that if I misrepresent, falsify or conceal information regarding my participation in the professional service described above, I may be subject to fine, imprisonment, or civil penalty under applicable federal laws.     Dannette Barbara, MD Attending Physician

## 2014-12-08 NOTE — OR Nursing (Signed)
OR Arrive    Patient's level of consciousness: awake and comfortable    Summary report reviewed: yes    Patient transported to OR via: gurney  Transported by: anesthesia person  Head of bed: elevated  Oxygen delivered via: N/A  Monitored enroute: yes, visually  Assumed care.

## 2014-12-08 NOTE — Anesthesia Postprocedure Evaluation (Signed)
ANESTHESIOLOGY POST OPERATIVE ASSESSMENT    Patient: Louis Taylor    EXCISION SOFT TISSUE UPPER LEG  TRANSFER ADJACENT TISSUE    Anesthesia Type: general    Vital Signs (Last Recorded):  BP: 102/64 mmHg  Pulse: 76  Resp: 17  Temp Max: 37.1 C (98.8 F)  (Last 24 hours)  Temp: 36.8 C (98.2 F)  SpO2: 95 % on    O2 Device: room air      Anesthesia Post Evaluation    Procedure: Procedure(s):EXCISION SOFT TISSUE UPPER LEGTRANSFER ADJACENT TISSUE  [facility]  Anesthesia: Anesthesia type not filed in the log.    Patient location during evaluation: PACU  Patient participation: complete - patient participated  Level of consciousness: awake and alert  Pain management: satisfactory to patient  Airway patency: patent  Anesthetic complications: no  Cardiovascular status: blood pressure returned to baseline and stable  Respiratory status: nonlabored ventilation, spontaneous ventilation and room air  Hydration status: euvolemic     Dorothey Baseman, MD        No apparent anesthetic complications.  Appropriate for discharge from PACU.  All questions answered.    Electronically signed by:  Dorisann Frames Josuel Koeppen, M.D., M.S.  Resident Physician - PGY-4  Anesthesiology & Pain Medicine  PI #:  [id number]    Pager #:  [phone]  Anesthesia Code pgr: [id number]  Pediatric Pain Service pgr:  [id number]  Acute/Regional Pain pager: [phone]  Chronic Pain pager: [phone] [place] Comment)  OB Pager [phone]

## 2014-12-08 NOTE — Nurse Assessment (Signed)
Given discharge instructions to patient regarding care of JP drains to his left lower extremity, including how to empty and record output, s/s of infection, when to seek emergent medical help.

## 2014-12-08 NOTE — Op Note (Signed)
DETAILED OPERATION RECORD     PREOPERATIVE DIAGNOSIS:    1.  Leiomyosarcoma left lower extremity.    POSTOPERATIVE DIAGNOSIS:    1.  Same     PROCEDURE:  1.  Radical resection left lower extremity leiomyosarcoma, CPT code number 46270.    2.  Local tissue transfer 90 cm2, CPT code number 35009 and 38182.     PREOPERATIVE ASSESSMENT:    This is a 58yr male with a complex medical and surgical history. For full details of the evaluation and management of this patient's index neoplasm, please see the [facility] electronic medical record. In brief, the patient presented with an enlarging soft tissue tumor in the lateral aspect of his left knee. Cross-sectional imaging was suspicious for a malignancy.  Image-guided biopsy demonstrated a leiomyosarcoma. Staging studies showed no evidence of metastatic disease. The patient was referred for combined modality therapy at the [facility]. Given the presence of apparent localized disease, the patient was felt to be a candidate for resection with curative intent.     The patient was counseled regarding the risks and benefits of radical resection. He was informed that this approach was associated with superior oncologic outcome. He was informed that surgical resection was the only potentially curative therapy. The patient was informed of the risks of surgery.  These included bleeding, infection, and need for further surgery. There is risk of damage to adjacent structures.  There is a risk of wound complications such as hematoma, seroma, or abscess. He was informed of the indication to resect a skin paddle over the tumor as a margin given the subcutaneous location of the tumor with mass effect on the skin. This would increase tumor clearance and likelihood of complete resection, but would also leave a soft tissue defect which would require soft tissue reconstruction. A possible skin graft or rotational flap may be indicated. This would depend on intra-operative findings.    The  patient was also informed that there is a risk of both acute and chronic morbidity related to the musculoskeletal resection. The patient understood that additional recommendations would depend on the final pathology. The patient was informed of the risks related to general anesthesia.  These include stroke, heart attack and death.  After a full discussion of the risks and benefits of the procedure, he consented to proceed.     DESCRIPTION OF THE PROCEDURE:    The patient was brought to the Operating Room.  General anesthesia was induced.  He was positioned in the supine position. Appropriate pressure points were padded in conjunction with Nursing and Anesthesia.  The left lower extremity was prepped and draped in standard surgical fashion. The right thigh was prepped and draped in case a skin graft was indicated. A preoperative time-out was performed.    The leiomyosarcoma was identified in the left lower extremity lateral to the left knee. A 2 cm margin was marked around the palpable mass and taut skin. This skin paddle was indicated as noted above in order to incorporate the skin immediately over the tumor within the resection specimen to optimize tumor-free margins and tumor clearance. Clinically, the tumor measured 6 x 5 cm. Given the 2 cm distance in all directions around the tumor, a circular incision was created which measured 10 cm superior to inferior x 9 cm medial to lateral. This skin paddle was incorporated into the radical resection specimen as noted above to excise the potentially involved skin immediately over the tumor.  The incision was made sharply. It was deepened through the dermis into the subcutaneous tissue. Flaps were then raised circumferentially around the tumor. Care was taken to maintain a safe margin around the tumor. The subcutaneous fat was dissected through Scarpa's fascia down to the muscular fascia. The muscular fascia was incised, and a subfascial plane of dissection was  developed. The muscular fascia was maintained as the deep margin of dissection for oncologic indications. The deep dissection was completed circumferentially, exposing the vastus lateralis muscle , the long head of biceps femoris muscle and tendon, and the ligamentous attachments of the lateral aspect of the knee. In this way, the specimen was excised en bloc and delivered to Pathology. Visual examination confirmed the tumor within the specimen.      The wound bed was then examined. Irrigation was performed. The tissues were infiltrated with 0.25% Marcaine for postoperative analgesia.  Hemostasis was obtained.  This was felt to be satisfactory. The soft tissue defect was then examined. It was approximately 90 cm2 given the circular skin paddle which had been excised overlying the soft tissue sarcoma. This skin and subcutaneous tissue measured 10 cm craniocaudad x 9 cm medial to lateral, for a total soft tissue defect of 90 cm2. The flaps were examined. They appeared to be viable and well-perfused. At this stage, there was excess tension on the skin flaps, and primary closure was not possible at this stage. However, in my judgement, I felt that extending the incision in a modified Z-plasty approach would allow for additional undermining and skin flap mobilization such that primary closure would be possible and avoid the morbidity of a skin graft.    Therefore, the incision was extended superiorly and inferior for a distance of approximately 10 cm in either direction. In addition, superiorly and laterally, a relaxing incision was made. This relaxing incision was oriented at a 45 degree angle to the superior border of the incision and inferior/lateral. The flaps were then undermined at the level of the fascia in order to mobilize the flaps to advance them over the soft tissue defect. Sufficient undermining was performed such that the defect was amenable to primary closure through local tissue advancement flaps. The  majority of the undermining was performed posterior/laterally since the medial/ anterior flap extended toward the patella and tibio-femoral joint. After extensive undermining, it appeared that the cutaneous flaps were able to be successfully advanced over the soft tissue defect without creating undue tension on the wound closure. Therefore, local tissue transfer was performed to close the complex wound.     Two JP drains were placed through separate stab incisions and secured with nylon suture.  The skin flaps were then advanced over the soft tissue defect to accomplish a layered closure.  This local tissue rearrangement appeared to sufficiently close the defect. Scarpa's fascia was re-approximated with interrupted 2-0 Vicryl suture.  The deep dermis was re-approximated with 3-0 Vicryl suture.  The skin was closed with running 2-0 nylon suture.    Sterile dressings and a knee immobilizer were applied. The patient tolerated the procedure well. He was taken to the Recovery Room in stable condition. I was present for the entire procedure.      Crist Infante, MD  Associate Professor  Surgical Oncology

## 2014-12-08 NOTE — Anesthesia Preprocedure Evaluation (Addendum)
Anesthesia Evaluation    History of Present Illness  68 y/o male with LLE leiomyosarcoma presents for mass excision and possible STSG. NKDA. Denies fever, chills, nausea, vomiting, new rashes and new illness.    Airway   Mallampati: I  TM distance: >3 FB     Neck ROM is full. Dental    (+) edentulous   Pulmonary - normal exam  (-) pneumonia, COPD, asthma, shortness of breath, recent URI, sleep apnea, rales, decreased breath sounds, wheezes    Pulmonary ROS comment: Former smoker (quit 30 years ago)  Patient's breath sounds clear to auscultation. Cardiovascular - cardiovascular exam normal  (+)  hypertriglyceridemia,   (-) pacemaker, hypertension, valvular problems/murmurs, past MI, CAD, CABG/stent, dysrhythmias, angina, CHF, orthopnea, PND, DOE, murmur, weak pulses, peripheral edema    ECG reviewed  Rhythm: regular  Rate: normal  Cardiovascular ROS comment: H/o PE with sepsis after gastric surgery    EKG 12/06/14: NSR at 63 bpm, RBBB, left anterior fascicular block.  Patient has good exercise tolerance. (Can ascend a flight of stairs without dyspnea or angina.  )   Neuro/Psych    (+) no chronic back pain  (-) no seizures, neuromuscular disease, TIA, CVA    Neuro/Psych ROS comment: Numbness in the right 3rd & 4th fingers, migraines  Patient has no psychiatric history.   GI/Hepatic/Renal   (-) scaphoid  (+) GERD (well controlled),   (-) chronic renal disease, PUD, hepatitis and liver disease      Comments: S/p sleeve gastrectomy, h/o ARF s/p sleeve gastrectomy/PE   Endo/Other    (+) diabetes mellitus well controlled type 2,     Comments: Obesity, thyroid dysfunction,                      Anesthesia Plan    ASA 3     general   Ricky Ala - pt seen and plan discussed prior to anesthesia. )  intravenous induction   Anesthetic plan and risks discussed with patient.  Resident/Fellow/CRNA discussed the plan with the attending.  I personally performed a physical assessment on this patient.        Verlee Monte, MD  PGY-2,  [facility] of Anesthesiology  [phone]

## 2014-12-08 NOTE — Nurse Discharge Note (Signed)
Discharge to home via wheelchair and crutches provided to private auto at [facility].   Belongings with patient.  See EMR for assessment.  Alvester Chou, RN

## 2014-12-13 ENCOUNTER — Telehealth: Payer: Self-pay

## 2014-12-13 NOTE — Telephone Encounter (Signed)
Called patient to discuss his status postoperative. He is overall doing very well. Adequate pain control. No fevers/chills/sweats. Has not taken off knee immobilizer since the operation.    Pathology still pending.    Will try to schedule follow up at [facility] this week to assess wound and change bandages. He verbalized understanding. He was thankful for call.      Crist Infante, MD  Associate Professor  Surgical Oncology

## 2014-12-16 ENCOUNTER — Ambulatory Visit: Payer: Medicare Other | Attending: Nurse Practitioner | Admitting: Nurse Practitioner

## 2014-12-16 VITALS — BP 125/70 | HR 66 | Temp 99.1°F | Resp 20 | Ht 68.0 in | Wt 285.3 lb

## 2014-12-16 DIAGNOSIS — D492 Neoplasm of unspecified behavior of bone, soft tissue, and skin: Secondary | ICD-10-CM

## 2014-12-16 DIAGNOSIS — Z483 Aftercare following surgery for neoplasm: Principal | ICD-10-CM | POA: Insufficient documentation

## 2014-12-16 DIAGNOSIS — C4922 Malignant neoplasm of connective and soft tissue of left lower limb, including hip: Secondary | ICD-10-CM | POA: Insufficient documentation

## 2014-12-16 NOTE — Nursing Note (Signed)
Vital signs taken, allergies verified, pharmacy confirmed, pain scale screened.     Marcelene Weidemann MA

## 2014-12-20 ENCOUNTER — Ambulatory Visit: Payer: Medicare Other

## 2014-12-20 DIAGNOSIS — C4922 Malignant neoplasm of connective and soft tissue of left lower limb, including hip: Secondary | ICD-10-CM | POA: Insufficient documentation

## 2014-12-20 DIAGNOSIS — Z483 Aftercare following surgery for neoplasm: Principal | ICD-10-CM | POA: Insufficient documentation

## 2014-12-20 LAB — SURGICAL PATHOLOGY

## 2014-12-20 NOTE — Progress Notes (Signed)
ATTENDING NOTE/LINKING LANGUAGE:    I did not personally see this patient, but I reviewed and discussed the patient with the resident and agree with the resident's assessment and plan we developed, as outlined in the resident's note.  Report electronically signed by Dannette Barbara, MD. Attending      Tumor completely excised with negative margins. Do not anticipate additional adjuvant therapy will be indicated. Awaiting final pathology results from [facility].    Satisfactory postoperative recovery to date. Will continue to monitor closely.    Return to clinic approximately 2 weeks for suture removal given advancement flaps over soft tissue defect.      Crist Infante, MD  Associate Professor  Surgical Oncology

## 2014-12-20 NOTE — Progress Notes (Signed)
Surgical Oncology Clinic Note    Louis Taylor returns for follow up regarding his history of left leg soft tissue mass. He is status post wide local excision. He denies any fevers, chills, constitutional symptoms of nausea, vomiting, diarrhea, constipation or excessive fatigue.  He reports drain output of 25cc from the top drain tube and less than 10cc from the lower one. He reports had not removed the dressing and immobilizer applied since hospital discharge.      PE:  Appears in no acute distress.  Vital signs:  BP 125/70 mmHg    Pulse 66    Temp(Src) 37.3 C (99.1 F) (Oral)    Resp 20  Ht 1.727 m (5\' 8" )    Wt 129.411 kg (285 lb 4.8 oz)    BMI 43.39 kg/m2  SpO2 97%  Left leg incision is intact with sutures. Mild erythema and swelling noted. No dehiscence. Serosanguinous fluid draining out of the lower drain port around the tube.   Small amount of fluid in the drain tube. Attempted declotting the tube with no success.   The top tube draining moderate amount of serous fluid after stripping.      DIAGNOSIS     A. LEG, LEFT SARCOMA (EXCISION):  - SEE COMMENT    B. LEG, LEFT LATERAL MARGIN (EXCISION):  - SEE COMMENT    Comment: This case will be sent to [facility] for consultation. The consultant's report  will be issued as an addendum. This case was discussed with Dr. [name] via  telephone on 12/14/14 at 5:30 PM.    TISSUES     A. Leg - LEFT LEG SARCOMA  B. Leg - LEFT LATERAL MARGIN    A/P  Left leg sarcoma  Discussed with Dr [name]  Lower drain tube discontinued.  Drain teaching and dressing changes instructions given.  Return for follow up on Monday as scheduled.      Attending: Dr [name].

## 2014-12-20 NOTE — Nursing Note (Signed)
Vital signs taken, allergies verified, pharmacy confirmed, pain scale screened.     Ammara Raj MA

## 2014-12-20 NOTE — Progress Notes (Addendum)
SURGICAL ONCOLOGY CLINIC NOTE    IDENTIFYING DATA  Louis Taylor is a 40yr male s/p left lower extremity Radical resection left lower extremity leiomyosarcoma    SUBJECTIVE  Doing well, pain controlled, still wearing brace as directed.  Hasn't recorded drain output but says its been about 10 ml per day last 3 days.      PHYSICAL EXAMINATION  BP 127/74 mmHg  Pulse 67  Temp(Src) 37 C (98.6 F) (Oral)  Resp 18  Ht 1.778 m (5\' 10" )  Wt 127.597 kg (281 lb 4.8 oz)  BMI 40.36 kg/m2  SpO2 97%    General: pleasant, NAD  Wound: left lateral leg incision healing well with some reactive redness around incision, no signs of infection.  Sutures in place.  Drain with serous fluid in tube.    Extremites: Warm, no edema     Pathology  Tumor excised, margins negative.  Thinnest margin was deep at 0.3cm      ASSESSMENT / PLAN  Louis Taylor is a 39yr male presenting for follow-up.  Doing well, incision intact and healing.  Drain removed today.    - Continue limited activity with left leg.  Continue brace  - Will follow-up with Dr [name] in 2 weeks.  Sutures will be removed then    Patient discussed and plan developed with Dr. [name]    Report electronically signed by:  Wilfred Lacy, MD PGY-6, PI# [phone]  Pager: [phone]  Surgical Oncology

## 2014-12-20 NOTE — Communication Body (Signed)
SURGICAL ONCOLOGY CLINIC NOTE    IDENTIFYING DATA  Louis Taylor is a 61yr male s/p left lower extremity Radical resection left lower extremity leiomyosarcoma    SUBJECTIVE  Doing well, pain controlled, still wearing brace as directed.  Hasn't recorded drain output but says its been about 10 ml per day last 3 days.      PHYSICAL EXAMINATION  BP 127/74 mmHg  Pulse 67  Temp(Src) 37 C (98.6 F) (Oral)  Resp 18  Ht 1.778 m (5\' 10" )  Wt 127.597 kg (281 lb 4.8 oz)  BMI 40.36 kg/m2  SpO2 97%    General: pleasant, NAD  Wound: left lateral leg incision healing well with some reactive redness around incision, no signs of infection.  Sutures in place.  Drain with serous fluid in tube.    Extremites: Warm, no edema     Pathology  Tumor excised, margins negative.  Thinnest margin was deep at 0.3cm      ASSESSMENT / PLAN  Louis Taylor is a 79yr male presenting for follow-up.  Doing well, incision intact and healing.  Drain removed today.    - Continue limited activity with left leg.  Continue brace  - Will follow-up with Dr [name] in 2 weeks.  Sutures will be removed then    Patient discussed and plan developed with Dr. [name]    Report electronically signed by:  Wilfred Lacy, MD PGY-6, PI# [phone]  Pager: [phone]  Surgical Oncology    ATTENDING NOTE/LINKING LANGUAGE:    I did not personally see this patient, but I reviewed and discussed the patient with the resident and agree with the resident's assessment and plan we developed, as outlined in the resident's note.  Report electronically signed by Dannette Barbara, MD. Attending      Tumor completely excised with negative margins. Do not anticipate additional adjuvant therapy will be indicated. Awaiting final pathology results from [facility].    Satisfactory postoperative recovery to date. Will continue to monitor closely.    Return to clinic approximately 2 weeks for suture removal given advancement flaps over soft tissue defect.      Crist Infante, MD  Associate  Professor  Surgical Oncology

## 2014-12-22 NOTE — Addendum Note (Signed)
Addendum  created 12/22/14 1453 by Dannielle Burn, MD    Modules edited: Anesthesia Attestations    Anesthesia Attestations:  Attestation DeletedAttestation Deleted

## 2014-12-23 LAB — CHROMOSOME ANALYSIS,TUMOR: CHROMOSOME ANAL,TUMOR RESULT: NORMAL

## 2015-01-03 ENCOUNTER — Ambulatory Visit: Payer: Medicare Other

## 2015-01-03 VITALS — BP 117/74 | HR 78 | Temp 98.6°F | Resp 18 | Wt 286.0 lb

## 2015-01-03 DIAGNOSIS — Z9889 Other specified postprocedural states: Secondary | ICD-10-CM | POA: Insufficient documentation

## 2015-01-03 DIAGNOSIS — Z483 Aftercare following surgery for neoplasm: Secondary | ICD-10-CM | POA: Insufficient documentation

## 2015-01-03 DIAGNOSIS — Z4803 Encounter for change or removal of drains: Principal | ICD-10-CM | POA: Insufficient documentation

## 2015-01-03 DIAGNOSIS — C4922 Malignant neoplasm of connective and soft tissue of left lower limb, including hip: Secondary | ICD-10-CM | POA: Insufficient documentation

## 2015-01-03 DIAGNOSIS — C499 Malignant neoplasm of connective and soft tissue, unspecified: Secondary | ICD-10-CM | POA: Insufficient documentation

## 2015-01-03 NOTE — Progress Notes (Signed)
SURGICAL ONCOLOGY CLINIC NOTE    [name] returns to [facility] for postoperative Surgical Oncology evaluation following radical resection of subcutaneous leiomyosarcoma of the lateral aspect of the left lower extremity. Prior biopsy was performed on 10/21/14. This revealed pleomorphic leiomyosarcoma. CT chest demonstrated pulmonary nodules, but these had been identified previously and were felt to be consistent with benign etiology.    The patient's index operation was performed on 11/28/14. The wound was closed primarily with local advancement flaps given the indication to resect skin as the anterior margin en bloc with the subcutaneous tumor.    The patient has been managed as an outpatient for drain removal. He returns today for interval evaluation. Clinically, he is overall well. He denies fevers/chills/sweats. Pain control is adequate. He does report clear drainage from his incision. He is managing this drainage with dry dressings on a daily basis. He is otherwise asymptomatic.    On examination, he is no acute distress.  Temp: 37 C (98.6 F) (01/25 0957)  Temp src: Oral (01/25 0957)  Pulse: 78 (01/25 0957)  BP: 117/74 mmHg (01/25 0957)  Resp: 18 (01/25 0957)  SpO2: 96 % (01/25 0957)  Height: --  Weight: 129.729 kg (286 lb) (01/25 0957)    Left lower extremity incision shows 30+ longitudinal incision with sutures in place. Lateral advancement flap is well-perfused and viable. Capillary refill is good. At the superior and inferior border of the flap, there are 2 small areas of skin dehiscence. There are small areas of devitalized tissue at base of skin dehiscence, consistent with fat necrosis. These areas measure approximately 3 x 1 cm superiorly and 2 x 1 cm inferiorly.    These 2 areas were debrided sharply. The underlying tissue was clean. There was minimal serous drainage after debridement of the superior site of skin dehiscence.    The remainder of the sutures were removed, and the  skin edges were intact.    The distal neurovascular examination of the left lower extremity is intact.    DATA REVIEW: pathology results are as noted below:    SUPPLEMENTARY REPORT                Addendum # 1            Entered: 12/20/14-1808         REASON FOR ADDENDUM:                    Consultant's report available            This case was reviewed in consultation by Basilia Jumbo, M.D., of [facility] and [facility], Department of Pathology and Laboratory      Medicine, [place], [place]            FINAL DIAGNOSIS:                 LEG, LEFT, MASS (EXCISION) [OSR# [id number]; 12/08/2014; 11 SLIDES]:           -    Leiomyosarcoma, high grade (See Comment)           -    Inked margins are negative for tumor            COMMENT:      The morphology is consistent with a high grade leiomyosarcoma. Per report, a      previous biopsy performed at an outside institution [address] [phone], 10/29/2014)  showed morphologic and immunohistochemical features most consistent with pleomorphic      leiomyosarcoma, and was strongly positive for caldesmon and beta-catenin      (cytoplasmic), focally positive for SMA and desmin, and negative for EMA, S100,      MelanA, CD117, DOG1, CD34, and CD38.            The complete report has been scanned to the EMR.      Addendum Signed (electronically signed) [name], MD 12/20/14 1809                     GROSS DESCRIPTION                       A. Received fresh, labeled with the patient's name and medical record number, and      designated as "left leg sarcoma, CCBR, cytogenetics; short - superior; long -      lateral," is a 49.1 gram oriented portion of soft tissue with one short stitch      indicated to be superior and a long stitch is lateral. The specimen is 6.5 cm      (medial to lateral) x 6.5 cm (superior to inferior) x 2.5 cm (superficial to deep).      The superficial surface is covered by a circular portion of wrinkled pink-tan skin      (5.5  cm medial to lateral x 5.5 cm superior to inferior). The entire outer surface      will be inked blue. The specimen is serially sectioned from medial to lateral to      reveal a well-circumscribed, firm, tan-white mass (3.5 cm medial to lateral x 3.0 cm      superior to inferior x 2.4 cm superficial to deep). Cut surfaces are nodular with      focal areas of hemorrhage. This mass is immediately subjacent to the surfacing skin,      is 0.3 cm from the deep margin, 0.8 cm from the inferior margin, 1.0 cm from the      lateral margin, 1.5 cm from the medial margin, and 1.5 cm from the superior margin.      The remaining cut surfaces are lobulated and tan-yellow and otherwise unremarkable.      A portion of the mass is submitted for cytogenetics, and a portion is submitted for      research. Representative sections of the remainder of the specimen are submitted as      follows:            21775 A1       mass with nearest deep margin      21775 A2       mass with nearest surfacing skin and inferior margin      21775 A3       nearest lateral margin      21775 A4       nearest medial margin      21775 A5       nearest superior margin      21775 A6-A7    additional representative sections of mass                     -    A6   one full thickness cross section of the mass      sl:ajh            B. Received in formalin, labeled with the  patient's name and medical record number,      and designated as "left lateral margin," is an unoriented, roughly triangular, 4.2 x      4.1 cm segment of wrinkled tan skin excised to a depth of 1.5 cm maximally. The soft      tissue margins are inked blue. The specimen is sectioned revealing homogeneous,      glistening, yellow, lobulated cut surfaces. No focal mass lesions are identified      grossly. Representative sections are submitted in cassettes [id number] B1-B4.      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 Widely clear margins were accomplished.    I had a long discussion with the patient regarding the natural history and disease biology of leiomyosarcoma. I indicated that the risk of distant recurrence is approximately 20 - 25% over 5 years of follow up and 35 - 45% over 10 years of follow up. For this reason, ongoing surveillance is indicated. The predominant site of distant recurrence with extremity leiomyosarcoma is pulmonary metastases. Therefore, I have recommended routine CT chest for soft tissue sarcoma surveillance. I have asked him to follow up with me in approximately 4 months with labs and cross-sectional imaging for surveillance.    The risk of local recurrence in the setting of widely clear margins is approximately 10 - 15%. This risk can be decreased by adjuvant radiotherapy, but there are significant risks from adjuvant radiotherapy. This includes both acute and chronic morbidity. Given the small absolute risk reduction from radiotherapy (5% absolute risk reduction), in my judgement, the risks of radiotherapy outweigh the benefits, especially given the risk of joint toxicity, fibrosis, and contracture. The patient verbalized understanding and is comfortable with this approach.    Regarding his wound issues, there appears to be some skin and fat necrosis likely from tension on the advancement flaps. The site is currently clean. I have recommended conservative management for now. The patient will contact me if there are issues related to fevers/chills/sweats, increasing drainage, increased pain, or increased widening of the small wound. He is comfortable with local care for now.    Mr.  Nichlas Pitera is comfortable with these recommendations and eager to proceed. He knows to contact me with questions or concerns.      Crist Infante, MD  Associate Professor  Surgical Oncology

## 2015-01-03 NOTE — Nursing Note (Signed)
Patient was verified with 2 identifiers, vital signs taken, allergies verified, patient was screened for pain.     Louis Taylor M. Louis Lewellen, MA

## 2015-01-03 NOTE — Communication Body (Signed)
SURGICAL ONCOLOGY CLINIC NOTE    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 Prior biopsy was performed on 10/21/14. This revealed pleomorphic leiomyosarcoma. CT chest demonstrated pulmonary nodules, but these had been identified previously and were felt to be consistent with benign etiology.    The patient's index operation was performed on 11/28/14. The wound was closed primarily with local advancement flaps given the indication to resect skin as the anterior margin en bloc with the subcutaneous tumor.    The patient has been managed as an outpatient for drain removal. He returns today for interval evaluation. Clinically, he is overall well. He denies fevers/chills/sweats. Pain control is adequate. He does report clear drainage from his incision. He is managing this drainage with dry dressings on a daily basis. He is otherwise asymptomatic.    On examination, he is no acute distress.  Temp: 37 C (98.6 F) (01/25 0957)  Temp src: Oral (01/25 0957)  Pulse: 78 (01/25 0957)  BP: 117/74 mmHg (01/25 0957)  Resp: 18 (01/25 0957)  SpO2: 96 % (01/25 0957)  Height: --  Weight: 129.729 kg (286 lb) (01/25 0957)    Left lower extremity incision shows 30+ longitudinal incision with sutures in place. Lateral advancement flap is well-perfused and viable. Capillary refill is good. At the superior and inferior border of the flap, there are 2 small areas of skin dehiscence. There are small areas of devitalized tissue at base of skin dehiscence, consistent with fat necrosis. These areas measure approximately 3 x 1 cm superiorly and 2 x 1 cm inferiorly.    These 2 areas were debrided sharply. The underlying tissue was clean. There was minimal serous drainage after debridement of the superior site of skin dehiscence.    The remainder of the sutures were removed, and the  skin edges were intact.    The distal neurovascular examination of the left lower extremity is intact.    DATA REVIEW: pathology results are as noted below:    SUPPLEMENTARY REPORT                Addendum # 1            Entered: 12/20/14-1808         REASON FOR ADDENDUM:                    Consultant's report available            This case was reviewed in consultation by Basilia Jumbo, M.D., of [facility] and [facility], Department of Pathology and Laboratory      Medicine, [place], [place]            FINAL DIAGNOSIS:                 LEG, LEFT, MASS (EXCISION) [OSR# [id number]; 12/08/2014; 11 SLIDES]:           -    Leiomyosarcoma, high grade (See Comment)           -    Inked margins are negative for tumor            COMMENT:      The morphology is consistent with a high grade leiomyosarcoma. Per report, a      previous biopsy performed at an outside institution [facility] [phone], 10/29/2014)  showed morphologic and immunohistochemical features most consistent with pleomorphic      leiomyosarcoma, and was strongly positive for caldesmon and beta-catenin      (cytoplasmic), focally positive for SMA and desmin, and negative for EMA, S100,      MelanA, CD117, DOG1, CD34, and CD38.            The complete report has been scanned to the EMR.      Addendum Signed (electronically signed) [name], MD 12/20/14 1809                     GROSS DESCRIPTION                       A. Received fresh, labeled with the patient's name and medical record number, and      designated as "left leg sarcoma, CCBR, cytogenetics; short - superior; long -      lateral," is a 49.1 gram oriented portion of soft tissue with one short stitch      indicated to be superior and a long stitch is lateral. The specimen is 6.5 cm      (medial to lateral) x 6.5 cm (superior to inferior) x 2.5 cm (superficial to deep).      The superficial surface is covered by a circular portion of wrinkled pink-tan skin      (5.5  cm medial to lateral x 5.5 cm superior to inferior). The entire outer surface      will be inked blue. The specimen is serially sectioned from medial to lateral to      reveal a well-circumscribed, firm, tan-white mass (3.5 cm medial to lateral x 3.0 cm      superior to inferior x 2.4 cm superficial to deep). Cut surfaces are nodular with      focal areas of hemorrhage. This mass is immediately subjacent to the surfacing skin,      is 0.3 cm from the deep margin, 0.8 cm from the inferior margin, 1.0 cm from the      lateral margin, 1.5 cm from the medial margin, and 1.5 cm from the superior margin.      The remaining cut surfaces are lobulated and tan-yellow and otherwise unremarkable.      A portion of the mass is submitted for cytogenetics, and a portion is submitted for      research. Representative sections of the remainder of the specimen are submitted as      follows:            21775 A1       mass with nearest deep margin      21775 A2       mass with nearest surfacing skin and inferior margin      21775 A3       nearest lateral margin      21775 A4       nearest medial margin      21775 A5       nearest superior margin      21775 A6-A7    additional representative sections of mass                     -    A6   one full thickness cross section of the mass      sl:ajh            B. Received in formalin, labeled with the  patient's name and medical record number,      and designated as "left lateral margin," is an unoriented, roughly triangular, 4.2 x      4.1 cm segment of wrinkled tan skin excised to a depth of 1.5 cm maximally. The soft      tissue margins are inked blue. The specimen is sectioned revealing homogeneous,      glistening, yellow, lobulated cut surfaces. No focal mass lesions are identified      grossly. Representative sections are submitted in cassettes [id number] B1-B4.      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 Widely clear margins were accomplished.    I had a long discussion with the patient regarding the natural history and disease biology of leiomyosarcoma. I indicated that the risk of distant recurrence is approximately 20 - 25% over 5 years of follow up and 35 - 45% over 10 years of follow up. For this reason, ongoing surveillance is indicated. The predominant site of distant recurrence with extremity leiomyosarcoma is pulmonary metastases. Therefore, I have recommended routine CT chest for soft tissue sarcoma surveillance. I have asked him to follow up with me in approximately 4 months with labs and cross-sectional imaging for surveillance.    The risk of local recurrence in the setting of widely clear margins is approximately 10 - 15%. This risk can be decreased by adjuvant radiotherapy, but there are significant risks from adjuvant radiotherapy. This includes both acute and chronic morbidity. Given the small absolute risk reduction from radiotherapy (5% absolute risk reduction), in my judgement, the risks of radiotherapy outweigh the benefits, especially given the risk of joint toxicity, fibrosis, and contracture. The patient verbalized understanding and is comfortable with this approach.    Regarding his wound issues, there appears to be some skin and fat necrosis likely from tension on the advancement flaps. The site is currently clean. I have recommended conservative management for now. The patient will contact me if there are issues related to fevers/chills/sweats, increasing drainage, increased pain, or increased widening of the small wound. He is comfortable with local care for now.    Mr.  Louis Taylor is comfortable with these recommendations and eager to proceed. He knows to contact me with questions or concerns.      Crist Infante, MD  Associate Professor  Surgical Oncology

## 2015-03-08 ENCOUNTER — Inpatient Hospital Stay: Admit: 2015-03-08 | Discharge: 2015-03-08 | Disposition: A | Discharge status: Home / Self Care | Payer: Self-pay

## 2015-04-11 ENCOUNTER — Telehealth: Payer: Self-pay

## 2015-04-11 NOTE — Telephone Encounter (Signed)
-----   Message from Dannette Barbara, MD sent at 04/11/2015 10:37 AM PDT -----  Regarding: RE: Orders from 12/2014  Not sure if they were ever done. My impression is that they were done externally, but we need to check before cancelling.    Thank you very much.      Crist Infante, MD  Associate Professor  Surgical Oncology      ----- Message -----     From: Kristopher Oppenheim, [place]: 04/11/2015  10:14 AM       To: Mahalia Longest, RN, Dannette Barbara, MD, #  Subject: Orders from 12/2014                              Hi Dr. [name],    In January, you placed orders for ct abd/pel and ct chest. Are you still wanting these orders to be done by the patient, or can I close out the referral?    Please advise.  Thank you,  Kristine Garbe III Auth Cord.  [place]  Phone [phone]  Fax [phone]

## 2015-04-25 ENCOUNTER — Ambulatory Visit: Payer: Medicare Other

## 2015-04-25 VITALS — BP 119/74 | HR 77 | Temp 98.2°F | Resp 16 | Ht 67.6 in | Wt 293.6 lb

## 2015-04-25 DIAGNOSIS — Z483 Aftercare following surgery for neoplasm: Secondary | ICD-10-CM | POA: Insufficient documentation

## 2015-04-25 DIAGNOSIS — C4922 Malignant neoplasm of connective and soft tissue of left lower limb, including hip: Principal | ICD-10-CM | POA: Insufficient documentation

## 2015-04-25 DIAGNOSIS — C499 Malignant neoplasm of connective and soft tissue, unspecified: Secondary | ICD-10-CM | POA: Insufficient documentation

## 2015-04-25 DIAGNOSIS — Z9889 Other specified postprocedural states: Secondary | ICD-10-CM | POA: Insufficient documentation

## 2015-04-25 NOTE — Nursing Note (Signed)
Identified patient by name and DOB, screened for pain and reviewed allergies. Took vitals and documented in patient chart.    Patient was Ambulatory    ADLs - Patient is independent with ADLs    Ona Rathert, MA

## 2015-04-26 NOTE — Progress Notes (Signed)
SURGICAL ONCOLOGY CLINIC NOTE    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 He is a 57yr male who is well known to me. He presented in November 2015 with a palpable soft tissue tumor of the left lower extremity adjacent to the lateral epicondyle of the left femur/knee. Percutaneous biopsy established a diagnosis of leiomyosarcoma. Staging studies demonstrated no evidence of metastases.    In December 2015, radical resection was performed. This required extensive skin excision and soft tissue reconstruction given the subcutaneous nature of the primary tumor. There wa a significant component of local tissue rearrangement and skin advancement flaps. The patient tolerated the operation well. Surgical pathology revealed a 3.5 cm high grade leiomyosarcoma completely excised with negative margins. We elected to defer adjuvant radiotherapy given the complete excision and favorable risk factors including size < 5 cm and subcutaneous nature of the tumor.     The patient returns today for interval evaluation. Clinically, he is doing very well. He denies pain in the left lower extremity. He denies fevers/chills/sweats. He denies palpable mass. He denies weight loss. In fact, he has gained several lbs because of stress related to caring for his debilitated wife. He denies cough or hemoptysis.    On examination, he is no acute distress.   Temp: 36.8 C (98.2 F) (05/16 1431)  Temp src: Oral (05/16 1431)  Pulse: 77 (05/16 1431)  BP: 119/74 mmHg (05/16 1431)  Resp: 16 (05/16 1431)  SpO2: 95 % (05/16 1431)  Height: 171.7 cm (5' 7.6") (05/16 1431)  Weight: 133.176 kg (293 lb 9.6 oz) (05/16 1431)  Body mass index is 45.17 kg/(m^2).  Head/Face negative icterus, mucous membranes moist.  Neck negative adenopathy, trachea midline.  Chest breasts moderate gynecomastia, stable, no masses. Axilla no adenopathy  bilaterally.  Abdomen obese, positive pannus, soft, no masses, no tenderness to palpation.   Groins positive pannus, negative masses, negative adenopathy.  Extremities warm without edema. He moves all 4 extremities with good range of motion.  Back negative masses, negative rashes.  Neuro non-focal.  Psych alert and oriented x 3.    A more careful examination of his left lower extremity was performed. A complex, eccentric scar is present from the mid-thigh to the proximal calf along the posterior/lateral aspect. The scar is well-healed with moderate hypertrophic scar formation in places. The surrounding tissues are supple with no tenderness to palpation. There is no palpable mass and no bony deformity. DP pulse is 2+.    DATA REVIEW:  1) CT chest was performed on 03/08/15. I personally reviewed images with findings as noted below.    Several subcentimeter pulmonary nodules remain present which are unchanged from prior cross-sectional imaging of the thorax performed in 2013. These are statistically considered benign.     2) CT abdomen and pelvis was performed on 03/08/15. I personally reviewed images with findings as noted below.    There are incidental findings including diverticular disease of the colon, but no evidence of malignancy.    ASSESSMENT AND PLAN: I had a long discussion with the patient regarding my impression and recommendations. I reviewed the clinical and radiographic findings. I indicated that he is no evidence of disease. His KPS is 100%.    I reviewed the natural history and disease biology of soft tissue sarcoma in general and leiomyosarcoma in particular. Overall, he is considered AJCC IIA (pT1aG3). This is a rare category of soft  tissue sarcoma. In addition, subcutaneous leiomyosarcoma are considered to have a favorable prognosis even in the setting of high grade histology. That forms a portion of the rationale for omitting adjuvant radiotherapy from his treatment plan. In addition, the potential  toxicities of radiotherapy in this location are significant, and the risks related to adjuvant radiotherapy include fibrosis, contracture, and lymphedema. These morbidities can be chronic and irreversible with significant impact on quality of life. For these reasons, the risks of adjuvant radiotherapy were felt to outweigh the benefits, especially when local control with wide surgical excision is felt to approach 90% or greater.    Nevertheless, the patient is recommended to continue with a program of soft tissue sarcoma surveillance. In this setting, my standard recommendation, in the absence of any new signs or symptoms and in the absence of any suspicious findings, is to follow the patient with annual physical examination and annual MRI for local surveillance. In addition, since there is also a risk of distant recurrence, my standard recommendation for patients similar to [name] is to perform annual CT chest for surveillance for distant recurrence.    Accordingly, I have asked the patient to follow up with me for a return office visit at [facility] in approximately 1 year with a CT chest for distant surveillance and an MRI of the left lower extremity for local surveillance.    [name] is comfortable with these recommendations and eager to proceed. He knows to contact me with questions or concerns.      Crist Infante, MD  Associate Professor  Surgical Oncology

## 2015-05-01 ENCOUNTER — Telehealth: Payer: Self-pay

## 2015-05-01 NOTE — Telephone Encounter (Signed)
-----   Message from Dannette Barbara, MD sent at 05/01/2015 12:49 PM PDT -----  Regarding: please archive  HI Louis Taylor,    Can you please archive outside CT chest, abdomen, and pelvis from 03/08/15? Thank you very much.    Mikki Santee

## 2015-05-01 NOTE — Communication Body (Addendum)
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 He is a 64yr male who is well known to me. He presented in November 2015 with a palpable soft tissue tumor of the left lower extremity adjacent to the lateral epicondyle of the left femur/knee. Percutaneous biopsy established a diagnosis of leiomyosarcoma. Staging studies demonstrated no evidence of metastases.    In December 2015, radical resection was performed. This required extensive skin excision and soft tissue reconstruction given the subcutaneous nature of the primary tumor. There wa a significant component of local tissue rearrangement and skin advancement flaps. The patient tolerated the operation well. Surgical pathology revealed a 3.5 cm high grade leiomyosarcoma completely excised with negative margins. We elected to defer adjuvant radiotherapy given the complete excision and favorable risk factors including size < 5 cm and subcutaneous nature of the tumor.     The patient returns today for interval evaluation. Clinically, he is doing very well. He denies pain in the left lower extremity. He denies fevers/chills/sweats. He denies palpable mass. He denies weight loss. In fact, he has gained several lbs because of stress related to caring for his debilitated wife. He denies cough or hemoptysis.    On examination, he is no acute distress.   Temp: 36.8 C (98.2 F) (05/16 1431)  Temp src: Oral (05/16 1431)  Pulse: 77 (05/16 1431)  BP: 119/74 mmHg (05/16 1431)  Resp: 16 (05/16 1431)  SpO2: 95 % (05/16 1431)  Height: 171.7 cm (5' 7.6") (05/16 1431)  Weight: 133.176 kg (293 lb 9.6 oz) (05/16 1431)  Body mass index is 45.17 kg/(m^2).  Head/Face negative icterus, mucous membranes moist.  Neck negative adenopathy, trachea midline.  Chest breasts moderate gynecomastia, stable, no masses. Axilla no adenopathy  bilaterally.  Abdomen obese, positive pannus, soft, no masses, no tenderness to palpation.   Groins positive pannus, negative masses, negative adenopathy.  Extremities warm without edema. He moves all 4 extremities with good range of motion.  Back negative masses, negative rashes.  Neuro non-focal.  Psych alert and oriented x 3.    A more careful examination of his left lower extremity was performed. A complex, eccentric scar is present from the mid-thigh to the proximal calf along the posterior/lateral aspect. The scar is well-healed with moderate hypertrophic scar formation in places. The surrounding tissues are supple with no tenderness to palpation. There is no palpable mass and no bony deformity. DP pulse is 2+.    DATA REVIEW:  1) CT chest was performed on 03/08/15. I personally reviewed images with findings as noted below.    Several subcentimeter pulmonary nodules remain present which are unchanged from prior cross-sectional imaging of the thorax performed in 2013. These are statistically considered benign.     2) CT abdomen and pelvis was performed on 03/08/15. I personally reviewed images with findings as noted below.    There are incidental findings including diverticular disease of the colon, but no evidence of malignancy.    ASSESSMENT AND PLAN: I had a long discussion with the patient regarding my impression and recommendations. I reviewed the clinical and radiographic findings. I indicated that he is no evidence of disease. His KPS is 100%.    I reviewed the natural history and disease biology of soft tissue sarcoma in general and leiomyosarcoma in particular. Overall, he is considered AJCC IIA (pT1aG3). This is a rare category of soft  tissue sarcoma. In addition, subcutaneous leiomyosarcoma are considered to have a favorable prognosis even in the setting of high grade histology. That forms a portion of the rationale for omitting adjuvant radiotherapy from his treatment plan. In addition, the potential  toxicities of radiotherapy in this location are significant, and the risks related to adjuvant radiotherapy include fibrosis, contracture, and lymphedema. These morbidities can be chronic and irreversible with significant impact on quality of life. For these reasons, the risks of adjuvant radiotherapy were felt to outweigh the benefits, especially when local control with wide surgical excision is felt to approach 90% or greater.    Nevertheless, the patient is recommended to continue with a program of soft tissue sarcoma surveillance. In this setting, my standard recommendation, in the absence of any new signs or symptoms and in the absence of any suspicious findings, is to follow the patient with annual physical examination and annual MRI for local surveillance. In addition, since there is also a risk of distant recurrence, my standard recommendation for patients similar to [name] is to perform annual CT chest for surveillance for distant recurrence.    Accordingly, I have asked the patient to follow up with me for a return office visit at [facility] in approximately 1 year with a CT chest for distant surveillance and an MRI of the left lower extremity for local surveillance.    [name] is comfortable with these recommendations and eager to proceed. He knows to contact me with questions or concerns.      Crist Infante, MD  Associate Professor  Surgical Oncology

## 2015-05-03 ENCOUNTER — Telehealth: Payer: Self-pay

## 2015-05-03 NOTE — Telephone Encounter (Signed)
-----   Message from Cindie Crumbly sent at 05/03/2015  9:13 AM PDT -----  Regarding: RE: please archive  Hmmmmm, not sure how the CD came in.Marland KitchenMarland Kitchenperhaps patient brought in.Marland KitchenMarland KitchenI'll check into getting another CD.  Thanks  Dyann Ruddle  ----- Message -----     From: Dannette Barbara, MD     Sent: 05/03/2015   6:57 AM       To: Henrene Pastor  Subject: RE: please archive                               There are many obstacles to patient care in my experience.  ----- Message -----     From: Cindie Crumbly     Sent: 05/02/2015   5:50 PM       To: Dannette Barbara, MD, Lemar Livings  Subject: RE: please archive                               Interesting.Marland KitchenMarland KitchenI do not see this Image in Outside Images or Images.....  eileen  ----- Message -----     From: Dannette Barbara, MD     Sent: 05/01/2015  12:49 PM       To: Dannette Barbara, MD, Lemar Livings, #  Subject: please archive                                   HI Dyann Ruddle,    Can you please archive outside CT chest, abdomen, and pelvis from 03/08/15? Thank you very much.    Mikki Santee

## 2015-05-13 ENCOUNTER — Telehealth: Payer: Self-pay

## 2015-05-13 NOTE — Telephone Encounter (Signed)
-----   Message from Cindie Crumbly sent at 05/13/2015 11:20 AM PDT -----  Regarding: RE: please archive  Apologies re the delay.  I successfully faxed to:  Dawn at [facility], Radiology [place], [place]:  [phone], Phone:  [phone], a request to [employer] the CD ([employer] formatted), CT Chest Abdomen Pelvis, DOS:  03/05/2015.  [facility] Account information.  Dawn reassured that CD will be FedExd today.  Thank you for your patience.  Lamont Dowdy, MA  ----- Message -----     From: Dannette Barbara, MD     Sent: 05/03/2015   1:03 PM       To: Henrene Pastor  Subject: RE: please archive                               Doristine Devoid, thank you very much.    Mikki Santee  ----- Message -----     From: Cindie Crumbly     Sent: 05/03/2015   9:13 AM       To: Dannette Barbara, MD, Lemar Livings  Subject: RE: please archive                               Hmmmmm, not sure how the CD came in.Marland KitchenMarland Kitchenperhaps patient brought in.Marland KitchenMarland KitchenI'll check into getting another CD.  Thanks  Dyann Ruddle  ----- Message -----     From: Dannette Barbara, MD     Sent: 05/03/2015   6:57 AM       To: Henrene Pastor  Subject: RE: please archive                               There are many obstacles to patient care in my experience.  ----- Message -----     From: Cindie Crumbly     Sent: 05/02/2015   5:50 PM       To: Dannette Barbara, MD, Lemar Livings  Subject: RE: please archive                               Interesting.Marland KitchenMarland KitchenI do not see this Image in Outside Images or Images.....  eileen  ----- Message -----     From: Dannette Barbara, MD     Sent: 05/01/2015  12:49 PM       To: Dannette Barbara, MD, Lemar Livings, #  Subject: please archive                                   HI Dyann Ruddle,    Can you please archive outside CT chest, abdomen, and pelvis from 03/08/15? Thank you very much.    Mikki Santee

## 2015-05-16 ENCOUNTER — Telehealth: Payer: Self-pay

## 2015-05-16 NOTE — Telephone Encounter (Signed)
----- Message from Cindie Crumbly sent at 05/16/2015 10:33 AM PDT -----  Regarding: RE: please archive  Bingo!  Finally!  Retrieved CD:  Uploaded and archived CT Chest Abdomen Pelvis, 03/08/2015.  CD given to RN.  Thank you for your patience.  Dyann Ruddle Moss,ma  ----- Message -----     From: Dannette Barbara, MD     Sent: 05/13/2015   4:15 PM       To: Henrene Pastor  Subject: RE: please archive                               Rolla Plate. Thank you very much.    Mikki Santee  ----- Message -----     From: Cindie Crumbly     Sent: 05/13/2015  11:20 AM       To: Dannette Barbara, MD, Lemar Livings  Subject: RE: please archive                               Apologies re the delay.  I successfully faxed to:  Dawn at [facility], Radiology [place], [place]:  [phone], Phone:  [phone], a request to FedEx the CD (Dicom formatted), CT Chest Abdomen Pelvis, DOS:  03/05/2015.  [facility] Account information.  Dawn reassured that CD will be FedExd today.  Thank you for your patience.  Lamont Dowdy, MA  ----- Message -----     From: Dannette Barbara, MD     Sent: 05/03/2015   1:03 PM       To: Henrene Pastor  Subject: RE: please archive                               Doristine Devoid, thank you very much.    Mikki Santee  ----- Message -----     From: Cindie Crumbly     Sent: 05/03/2015   9:13 AM       To: Dannette Barbara, MD, Lemar Livings  Subject: RE: please archive                               Hmmmmm, not sure how the CD came in.Marland KitchenMarland Kitchenperhaps patient brought in.Marland KitchenMarland KitchenI'll check into getting another CD.  Thanks  Dyann Ruddle  ----- Message -----     From: Dannette Barbara, MD     Sent: 05/03/2015   6:57 AM       To: Henrene Pastor  Subject: RE: please archive                               There are many obstacles to patient care in my experience.  ----- Message -----     From: Cindie Crumbly     Sent: 05/02/2015   5:50 PM       To: Dannette Barbara, MD, Lemar Livings  Subject: RE: please archive                               Interesting.Marland KitchenMarland KitchenI do not see this Image in Outside  Images or Images.....  eileen  ----- Message -----     From: Dannette Barbara, MD     Sent: 05/01/2015  12:49 PM       To: Dannette Barbara, MD, Lemar Livings, #  Subject: please archive                                   HI Dyann Ruddle,    Can you please archive outside CT chest, abdomen, and pelvis from 03/08/15? Thank you very much.    Mikki Santee

## 2015-05-18 ENCOUNTER — Other Ambulatory Visit: Payer: Self-pay

## 2016-03-30 ENCOUNTER — Telehealth: Payer: Self-pay

## 2016-03-30 DIAGNOSIS — Z483 Aftercare following surgery for neoplasm: Secondary | ICD-10-CM

## 2016-03-30 DIAGNOSIS — C499 Malignant neoplasm of connective and soft tissue, unspecified: Principal | ICD-10-CM

## 2016-03-30 NOTE — Telephone Encounter (Signed)
Verified first name, last name, and DOB at initiation of contact.    Pt states he needs an MRI and CT scan prior to his appt in July 2017 but the orders have been closed.  He did recently receive an authorization in the mail but he never received a call to schedule.   Explained to pt the orders have expired and his MD will need to re-order the scans.  Once we receive auth on the new orders, he will get a call to schedule.  Pt requesting scans be done close to home. Will send message to auth coordinator to see if this is possible.

## 2016-04-01 NOTE — Telephone Encounter (Signed)
Labs and CT chest, abdomen, and pelvis ordered for same day as follow up appointment in September 2017. MRI will be arranged at time of follow up appointment at [place]. Thank you very much.      Crist Infante, MD  Associate Professor  Surgical Oncology

## 2016-04-04 ENCOUNTER — Telehealth: Payer: Self-pay

## 2016-04-04 NOTE — Telephone Encounter (Signed)
Selinda Eon - please forward patiet's CT orders as requested. Thank you.    Mahalia Longest, RN

## 2016-04-04 NOTE — Telephone Encounter (Signed)
Faxed Ct Orders again to [facility] Phone [phone].  Fax [phone]    Larry Sierras, [place] III

## 2016-04-04 NOTE — Telephone Encounter (Signed)
Patient identified by name, DOB, and mailing address.    The caller is (relationship): pt    Phone number(Company Name/Office): [phone]    Pharmacy (location selected):  na    Reason For Call: Pt called and stated he would like his CT scan orders sent to [facility] in [place].    Action Requested: Please advise pt    Thank you

## 2016-06-06 ENCOUNTER — Telehealth: Payer: Self-pay

## 2016-06-06 NOTE — Telephone Encounter (Signed)
Forwarding to covering [employer] coordinator for follow up.    Mahalia Longest, RN

## 2016-06-06 NOTE — Telephone Encounter (Signed)
Opened in error

## 2016-06-06 NOTE — Telephone Encounter (Signed)
Patient identified by name, DOB, and mailing address.    The caller is (relationship):Pt    Phone number(Company Name/Office):[phone]    Pharmacy (location selected): na    Reason For Call: Pt states that [facility] never received the CT order and is asking again for the order to be faxed to [phone]. Please notify patient once order is sent so he can follow up with the hospital.    Action Requested: Please fax order and notify caller.    Thank you

## 2016-06-07 NOTE — Telephone Encounter (Signed)
RN faxed requested orders to [place], RN

## 2016-06-25 ENCOUNTER — Ambulatory Visit: Payer: Medicare Other

## 2016-06-25 VITALS — BP 123/72 | HR 81 | Temp 99.2°F | Resp 16 | Ht 68.0 in | Wt 297.6 lb

## 2016-06-25 DIAGNOSIS — Z483 Aftercare following surgery for neoplasm: Secondary | ICD-10-CM | POA: Insufficient documentation

## 2016-06-25 DIAGNOSIS — R938 Abnormal findings on diagnostic imaging of other specified body structures: Secondary | ICD-10-CM | POA: Insufficient documentation

## 2016-06-25 DIAGNOSIS — C499 Malignant neoplasm of connective and soft tissue, unspecified: Secondary | ICD-10-CM | POA: Insufficient documentation

## 2016-06-25 DIAGNOSIS — Z9889 Other specified postprocedural states: Secondary | ICD-10-CM | POA: Insufficient documentation

## 2016-06-25 DIAGNOSIS — Z85831 Personal history of malignant neoplasm of soft tissue: Secondary | ICD-10-CM | POA: Insufficient documentation

## 2016-06-25 DIAGNOSIS — Z08 Encounter for follow-up examination after completed treatment for malignant neoplasm: Principal | ICD-10-CM | POA: Insufficient documentation

## 2016-06-25 NOTE — Progress Notes (Signed)
SURGICAL ONCOLOGY CLINIC NOTE    Dear Dr. Ronnell Guadalajara,    I had the pleasure of seeing [name] for repeat evaluation regarding his history of leiomyosarcoma of the left lower extremity. Although I know you are familiar with the patient's medical history, please allow me to summarize it for my own records.    The patient is a delightful 59yr male who was referred to me in December 2015 with a several year history of a soft tissue tumor in the subcutaneous tissues of the left lateral knee. The patient is a delightful 46yr male with a history of a soft tissue tumor in the lateral aspect of his left knee. When the soft tissue tumor began to enlarge and cause symptoms, he sought medical evaluation. Imaging findings were suspicious for neoplasm, and a biopsy established a diagnosis of leiomyosarcoma. Staging studies were negative for metastases.    In December 2015, the patient underwent wide excision with soft tissue reconstruction using local tissue advancement flaps. The patient tolerated the operation well. Surgical pathology was consistent with a complete resection. Given the pathology results, we elected to forego adjuvant radiotherapy from the patient's soft tissue sarcoma management. He has been under surveillance since that time. He returns today for interval evaluation.     Clinically, the patient is doing well. He remains very functional and independent in his activities of daily living. He reports persistent left knee pain. He denies pain in his lateral thigh/ knee incision. He reports progressive pain and upcoming left knee replacement, although a date for surgical procedure has not been determined. He reports a pain score of 0/10 in the office today. He takes no significant pain medications for pain management. The patient denies other soft tissue masses on her body. The patient denies recurrent soft tissue tumor in his surgical site. He denies fevers/chills/sweats. He denies weight loss. He  denies cough or hemoptysis.    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    History of pulmonary embolism    History of renal failure    Knee joint replacement status    Soft tissue tumor    History of gastric restrictive surgery    H/O umbilical hernia repair    Family history of cancer    Thyroid dysfunction    Mixed hyperlipidemia    Type 2 diabetes mellitus with complication    History of knee surgery    Arthritis, degenerative    Aftercare following surgery for cancer or tumor       Past surgical history:   Past Surgical History:   Procedure Laterality Date    ARTHROPLASTY, KNEE, TOTAL Right 2007    GASTRECTOMY, SLEEVE, LAPAROSCOPIC  2014    PR ADJ TISS XFER ANY AREA,30.1-60 SQCM  12/08/2014         PR ADJ TISS XFER ANY AREA,EA ADD 30.0 SQCM  12/08/2014         PR RAD RESECTION TUMOR SOFT TIS THIGH/KNEE 5 CM/>  12/08/2014         REPAIR, HERNIA, OPEN  2013       Medications:   Current Outpatient Prescriptions on File Prior to Visit   Medication Sig Dispense Refill    ASPIRIN PO Take 81 mg by mouth every day.      aspirin-acetaminophen-caffeine 250-250-65 mg Tablet  Take 65 mg by mouth if needed.      ATORVASTATIN CALCIUM (LIPITOR PO) Take 40 mg by mouth every day at bedtime. Take at 6pm every night.      CALCIUM CARBONATE/VITAMIN D3 (CALCIUM 500 + D, D3, PO) Take 5,000 Units by mouth every day.      LEVOTHYROXINE SODIUM (SYNTHROID PO) Take 137 mg by mouth every morning.      OMEPRAZOLE (PRILOSEC PO) Take 20 mg by mouth every day at bedtime.      VITAMIN D-3 PO Take 500 mg by mouth 2 times daily, before morning and evening meals.       No current facility-administered medications on file prior to visit.        Allergies: No Known Allergies    Social history: reviewed and unchanged  Social History     Social History    Marital status: MARRIED     Spouse name: N/A     Number of children: N/A    Years of education: N/A     Social History Main Topics    Smoking status: Former Smoker     Packs/day: 1.00     Years: 10.00     Types: Cigarettes    Smokeless tobacco: Never Used      Comment: approx 10 pack years, quit 30 years ago    Alcohol use 1.2 - 1.8 oz/week     0 - 1 Standard drinks or equivalent, 2 Shots of liquor per week      Comment: occasional    Drug use: No    Sexual activity: Not Asked     Other Topics Concern    None     Social History Narrative       Family history: reviewed and unchanged  Family History   Problem Relation Age of Onset    throat cancer [OTHER] Father      106s, died of disease     On examination, he is no acute distress.   Temp: 37.3 C (99.2 F) (07/17 1422)  Temp src: Oral (07/17 1422)  Pulse: 81 (07/17 1422)  BP: 123/72 (07/17 1422)  Resp: 16 (07/17 1422)  SpO2: 97 % (07/17 1422)  Height: 172.7 cm (5\' 8" ) (07/17 1422)  Weight: 135 kg (297 lb 9.9 oz) (07/17 1422)  Body mass index is 45.25 kg/(m^2).  Head/Face negative icterus, mucous membranes moist.  Neck negative adenopathy, trachea midline.  Chest breasts moderate gynecomastia, no masses. Axilla no adenopathy bilaterally.  Abdomen obese, soft, no masses, no tenderness to palpation.  Groins negative masses, negative adenopathy.  Extremities warm with trace right lower extremity edema. There are post-surgical changes from right knee replacement. There are post-surgical changes in his left lateral thigh and knee. He moves his bilateral upper extremities with good range of motion. There are osteoarthritis/ degenerative joint disease changes in bilateral knees with decreased range of motion.  Back negative masses, negative rashes.  Neuro non-focal.  Psych alert and oriented x 3.    A more detailed examination of his left lower extremity was performed. The left lateral thigh and knee incision is well-healed as noted above. There is no palpable mass and on bony deformity. There is no tenderness  to palpation. There is moderate contour deformity from soft tissue sarcoma excision and tissue advancement flaps.    DATA REVIEW: CT chest, abdomen, and pelvis was ordered for follow up surveillance, but has not been performed.    IMPRESSION AND RECOMMENDATIONS: I had a long  discussion with the patient regarding my impression and recommendations. I reviewed the clinical findings. I indicated he is clinically no evidence of disease. The patient's KPS is 90 - 100%.    I again discussed with him the natural history and disease biology of soft tissue sarcoma in general in leiomyosarcoma in particular. I indicated that subcutaneous leiomyosarcoma tends to have a favorable prognosis. The risk of tumor recurrence tends to be low, especially in the setting of complete resection. This knowledge of the expected outcomes of subcutaneous leiomyosarcoma provided the rationale for managing his soft tissue sarcoma with surgical monotherapy alone.     Despite this favorable prognosis, I still emphasized to the patient that ongoing surveillance is indicated to monitor him for the risk of recurrence. I discussed with him the risk of both local recurrence as well as distant recurrence. I indicated that the primary risk of distant recurrence is pulmonary metastases via hematogenous dissemination to the lungs. This forms the rationale for periodic cross-sectional imaging to assess for the risk of pulmonary metastases. A follow up CT chest was ordered but no performed for some reason. The patient reports some difficulty with the local imaging facility receiving authorization for this study. I indicated that I am not aware of the details of these authorization/ insurance issues, but I did indicate that the study is medically indicated in my judgement, and therefore, I have reordered the cross-sectional imaging study.     I discussed with him the risk of local recurrence. The risk of local recurrence in the setting of complete resection is  low, on the order of 5 - 10%. Prior MRI was negative for recurrence, and his clinical examination is also consistent with no evidence of local recurrence. I informed him that my recommendation is for annual MRI to complement his surveillance regimen. I indicated that we will arrange for this at the time of his next follow up in approximately 1 year, assuming his follow up CT is negative for evidence of malignancy.     The patient verbalized understanding. Mr. Ferron Karle is comfortable with these recommendations and eager to proceed. He knows to contact me with questions or concerns.      Crist Infante, MD  Associate Professor  Surgical Oncology

## 2016-06-25 NOTE — Nursing Note (Signed)
Patient identified with two identifiers, screened for pain, vitals taken, pharmacy verified, allergies verified.  Reviewed with patient if any new or worsening emotional or social concerns.     No- No action needed.    Anjelika Ausburn, MA I

## 2016-06-30 ENCOUNTER — Telehealth: Payer: Self-pay

## 2016-06-30 NOTE — Communication Body (Signed)
SURGICAL ONCOLOGY CLINIC NOTE    Dear Dr. Ronnell Guadalajara,    I had the pleasure of seeing [name] for repeat evaluation regarding his history of leiomyosarcoma of the left lower extremity. Although I know you are familiar with the patient's medical history, please allow me to summarize it for my own records.    The patient is a delightful 57yr male who was referred to me in December 2015 with a several year history of a soft tissue tumor in the subcutaneous tissues of the left lateral knee. The patient is a delightful 61yr male with a history of a soft tissue tumor in the lateral aspect of his left knee. When the soft tissue tumor began to enlarge and cause symptoms, he sought medical evaluation. Imaging findings were suspicious for neoplasm, and a biopsy established a diagnosis of leiomyosarcoma. Staging studies were negative for metastases.    In December 2015, the patient underwent wide excision with soft tissue reconstruction using local tissue advancement flaps. The patient tolerated the operation well. Surgical pathology was consistent with a complete resection. Given the pathology results, we elected to forego adjuvant radiotherapy from the patient's soft tissue sarcoma management. He has been under surveillance since that time. He returns today for interval evaluation.     Clinically, the patient is doing well. He remains very functional and independent in his activities of daily living. He reports persistent left knee pain. He denies pain in his lateral thigh/ knee incision. He reports progressive pain and upcoming left knee replacement, although a date for surgical procedure has not been determined. He reports a pain score of 0/10 in the office today. He takes no significant pain medications for pain management. The patient denies other soft tissue masses on her body. The patient denies recurrent soft tissue tumor in his surgical site. He denies fevers/chills/sweats. He denies weight loss. He  denies cough or hemoptysis.    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    History of pulmonary embolism    History of renal failure    Knee joint replacement status    Soft tissue tumor    History of gastric restrictive surgery    H/O umbilical hernia repair    Family history of cancer    Thyroid dysfunction    Mixed hyperlipidemia    Type 2 diabetes mellitus with complication    History of knee surgery    Arthritis, degenerative    Aftercare following surgery for cancer or tumor       Past surgical history:   Past Surgical History:   Procedure Laterality Date    ARTHROPLASTY, KNEE, TOTAL Right 2007    GASTRECTOMY, SLEEVE, LAPAROSCOPIC  2014    PR ADJ TISS XFER ANY AREA,30.1-60 SQCM  12/08/2014         PR ADJ TISS XFER ANY AREA,EA ADD 30.0 SQCM  12/08/2014         PR RAD RESECTION TUMOR SOFT TIS THIGH/KNEE 5 CM/>  12/08/2014         REPAIR, HERNIA, OPEN  2013       Medications:   Current Outpatient Prescriptions on File Prior to Visit   Medication Sig Dispense Refill    ASPIRIN PO Take 81 mg by mouth every day.      aspirin-acetaminophen-caffeine 250-250-65 mg Tablet  Take 65 mg by mouth if needed.      ATORVASTATIN CALCIUM (LIPITOR PO) Take 40 mg by mouth every day at bedtime. Take at 6pm every night.      CALCIUM CARBONATE/VITAMIN D3 (CALCIUM 500 + D, D3, PO) Take 5,000 Units by mouth every day.      LEVOTHYROXINE SODIUM (SYNTHROID PO) Take 137 mg by mouth every morning.      OMEPRAZOLE (PRILOSEC PO) Take 20 mg by mouth every day at bedtime.      VITAMIN D-3 PO Take 500 mg by mouth 2 times daily, before morning and evening meals.       No current facility-administered medications on file prior to visit.        Allergies: No Known Allergies    Social history: reviewed and unchanged  Social History     Social History    Marital status: MARRIED     Spouse name: N/A     Number of children: N/A    Years of education: N/A     Social History Main Topics    Smoking status: Former Smoker     Packs/day: 1.00     Years: 10.00     Types: Cigarettes    Smokeless tobacco: Never Used      Comment: approx 10 pack years, quit 30 years ago    Alcohol use 1.2 - 1.8 oz/week     0 - 1 Standard drinks or equivalent, 2 Shots of liquor per week      Comment: occasional    Drug use: No    Sexual activity: Not Asked     Other Topics Concern    None     Social History Narrative       Family history: reviewed and unchanged  Family History   Problem Relation Age of Onset    throat cancer [OTHER] Father      17s, died of disease     On examination, he is no acute distress.   Temp: 37.3 C (99.2 F) (07/17 1422)  Temp src: Oral (07/17 1422)  Pulse: 81 (07/17 1422)  BP: 123/72 (07/17 1422)  Resp: 16 (07/17 1422)  SpO2: 97 % (07/17 1422)  Height: 172.7 cm (5\' 8" ) (07/17 1422)  Weight: 135 kg (297 lb 9.9 oz) (07/17 1422)  Body mass index is 45.25 kg/(m^2).  Head/Face negative icterus, mucous membranes moist.  Neck negative adenopathy, trachea midline.  Chest breasts moderate gynecomastia, no masses. Axilla no adenopathy bilaterally.  Abdomen obese, soft, no masses, no tenderness to palpation.  Groins negative masses, negative adenopathy.  Extremities warm with trace right lower extremity edema. There are post-surgical changes from right knee replacement. There are post-surgical changes in his left lateral thigh and knee. He moves his bilateral upper extremities with good range of motion. There are osteoarthritis/ degenerative joint disease changes in bilateral knees with decreased range of motion.  Back negative masses, negative rashes.  Neuro non-focal.  Psych alert and oriented x 3.    A more detailed examination of his left lower extremity was performed. The left lateral thigh and knee incision is well-healed as noted above. There is no palpable mass and on bony deformity. There is no tenderness  to palpation. There is moderate contour deformity from soft tissue sarcoma excision and tissue advancement flaps.    DATA REVIEW: CT chest, abdomen, and pelvis was ordered for follow up surveillance, but has not been performed.    IMPRESSION AND RECOMMENDATIONS: I had a long  discussion with the patient regarding my impression and recommendations. I reviewed the clinical findings. I indicated he is clinically no evidence of disease. The patient's KPS is 90 - 100%.    I again discussed with him the natural history and disease biology of soft tissue sarcoma in general in leiomyosarcoma in particular. I indicated that subcutaneous leiomyosarcoma tends to have a favorable prognosis. The risk of tumor recurrence tends to be low, especially in the setting of complete resection. This knowledge of the expected outcomes of subcutaneous leiomyosarcoma provided the rationale for managing his soft tissue sarcoma with surgical monotherapy alone.     Despite this favorable prognosis, I still emphasized to the patient that ongoing surveillance is indicated to monitor him for the risk of recurrence. I discussed with him the risk of both local recurrence as well as distant recurrence. I indicated that the primary risk of distant recurrence is pulmonary metastases via hematogenous dissemination to the lungs. This forms the rationale for periodic cross-sectional imaging to assess for the risk of pulmonary metastases. A follow up CT chest was ordered but no performed for some reason. The patient reports some difficulty with the local imaging facility receiving authorization for this study. I indicated that I am not aware of the details of these authorization/ insurance issues, but I did indicate that the study is medically indicated in my judgement, and therefore, I have reordered the cross-sectional imaging study.     I discussed with him the risk of local recurrence. The risk of local recurrence in the setting of complete resection is  low, on the order of 5 - 10%. Prior MRI was negative for recurrence, and his clinical examination is also consistent with no evidence of local recurrence. I informed him that my recommendation is for annual MRI to complement his surveillance regimen. I indicated that we will arrange for this at the time of his next follow up in approximately 1 year, assuming his follow up CT is negative for evidence of malignancy.     The patient verbalized understanding. Louis Taylor is comfortable with these recommendations and eager to proceed. He knows to contact me with questions or concerns.      Crist Infante, MD  Associate Professor  Surgical Oncology

## 2016-06-30 NOTE — Telephone Encounter (Signed)
-----   Message from Dannette Barbara, MD sent at 06/30/2016  2:24 PM PDT -----  Regarding: patient needs CT scan  Patient needs CT as ordered. Can someone please look into this? I have ordered it twice now.     Thank you very much.      Crist Infante, MD  Associate Professor  Surgical Oncology

## 2016-07-02 ENCOUNTER — Telehealth: Payer: Self-pay

## 2016-07-02 NOTE — Telephone Encounter (Signed)
-----   Message from Larry Sierras, [facility] sent at 07/02/2016 10:47 AM PDT -----  Regarding: RE: patient needs CT scan  Hi Dr [name],  Insurance continues to approve at [facility].  Called and spoke with [place] at [employer] and had the facility changed to [facility].  Orders and auth information have been faxed to [facility] radiology    Thanks  Selinda Eon   ----- Message -----     From: Dannette Barbara, MD     Sent: 06/30/2016   2:24 PM       To: Mahalia Longest, RN, Dannette Barbara, MD, #  Subject: patient needs CT scan                            Patient needs CT as ordered. Can someone please look into this? I have ordered it twice now.     Thank you very much.      Crist Infante, MD  Associate Professor  Surgical Oncology

## 2016-07-06 ENCOUNTER — Ambulatory Visit (HOSPITAL_BASED_OUTPATIENT_CLINIC_OR_DEPARTMENT_OTHER)
Admission: RE | Admit: 2016-07-06 | Discharge: 2016-07-06 | Disposition: A | Discharge status: Home / Self Care | Payer: Medicare Other | Source: Ambulatory Visit

## 2016-07-13 ENCOUNTER — Other Ambulatory Visit: Payer: Self-pay

## 2016-07-13 DIAGNOSIS — R160 Hepatomegaly, not elsewhere classified: Secondary | ICD-10-CM | POA: Insufficient documentation

## 2016-07-13 DIAGNOSIS — C499 Malignant neoplasm of connective and soft tissue, unspecified: Secondary | ICD-10-CM

## 2016-07-13 DIAGNOSIS — Z483 Aftercare following surgery for neoplasm: Secondary | ICD-10-CM

## 2016-07-13 DIAGNOSIS — R932 Abnormal findings on diagnostic imaging of liver and biliary tract: Secondary | ICD-10-CM | POA: Insufficient documentation

## 2016-07-13 NOTE — Progress Notes (Signed)
2nd opinion CT chest, abdomen, and pelvis.      Crist Infante, MD  Associate Professor  Surgical Oncology

## 2016-07-16 ENCOUNTER — Telehealth: Payer: Self-pay

## 2016-07-16 NOTE — Telephone Encounter (Signed)
Patient identified by name, DOB, and mailing address.    The caller is (relationship):Patient    Phone number(Company Name/Office):[phone]    Pharmacy (location selected) and What type of Medication: na    Reason For Call: Pt states he had x rays done at [facility] 2 1/2 weeks ago and he would like to know the results.     Action Requested:Please advise    Thank you

## 2016-07-16 NOTE — Telephone Encounter (Signed)
Returned call to patient, verified ID x3. Informed patient we have received the CT results and Dr. [name] will call him to discuss when he returns from vacation, later this week. He verbalized understanding and was appreciative of update.    Mahalia Longest, RN

## 2016-07-24 ENCOUNTER — Other Ambulatory Visit: Payer: Self-pay

## 2016-07-24 ENCOUNTER — Telehealth: Payer: Self-pay

## 2016-07-24 DIAGNOSIS — C499 Malignant neoplasm of connective and soft tissue, unspecified: Principal | ICD-10-CM

## 2016-07-24 DIAGNOSIS — Z483 Aftercare following surgery for neoplasm: Secondary | ICD-10-CM

## 2016-07-24 DIAGNOSIS — R160 Hepatomegaly, not elsewhere classified: Secondary | ICD-10-CM

## 2016-07-24 DIAGNOSIS — R932 Abnormal findings on diagnostic imaging of liver and biliary tract: Secondary | ICD-10-CM

## 2016-07-24 NOTE — Telephone Encounter (Signed)
Called patient to discuss CT chest, abdomen, and pelvis results. Discussed with him results of [facility] interpretation versus [facility] interpretation. Discussed with him my suspicion that [facility] results are a false-positive/ overcall. Discussed with him my recommendation for MRI liver for further evaluation and to defer ultrasound-biopsy for now. He again expressed a preference for cross-sectional imaging to be performed locally. I indicated that this does lead to a delay in receiving the results since the images need to be forwarded to [facility] and then reviewed in Tumor Board to allow for him to be contacted with our impression and recommendations. He verbalized understanding. All questions answered. He was thankful for call.    [name] is comfortable with these recommendations and eager to proceed. He knows to contact me with questions or concerns.      Crist Infante, MD  Associate Professor  Surgical Oncology

## 2016-07-24 NOTE — Progress Notes (Signed)
See phone encounter for further details.      Ayan Cartel Mauss, MD  Associate Professor  Surgical Oncology

## 2016-07-26 NOTE — Progress Notes (Signed)
Selinda Eon - please forward MRI to [facility] in [place].    Thank you,  Mahalia Longest, RN

## 2016-08-02 ENCOUNTER — Telehealth: Payer: Self-pay

## 2016-08-02 DIAGNOSIS — C499 Malignant neoplasm of connective and soft tissue, unspecified: Principal | ICD-10-CM

## 2016-08-02 DIAGNOSIS — R932 Abnormal findings on diagnostic imaging of liver and biliary tract: Secondary | ICD-10-CM

## 2016-08-02 NOTE — Telephone Encounter (Signed)
Called patient to inform him [facility] is requiring BMP for upcoming CT scan on 8/30. BMP ordered and faxed to lab at [facility], [facility] ([place]), ph [phone], f [phone]. No answer at time of call, left voice message requesting return call.    Mahalia Longest, RN

## 2016-08-03 NOTE — Telephone Encounter (Signed)
Called patient and informed him of lab orders at [facility], needed for upcoming CT scan 8/30. He confirmed and stated he will get them done on Monday. He was appreciative of call.    Mahalia Longest, RN

## 2016-08-08 ENCOUNTER — Ambulatory Visit: Admit: 2016-08-08 | Discharge: 2016-08-08 | Disposition: A | Discharge status: Home / Self Care | Payer: Self-pay

## 2016-08-08 ENCOUNTER — Ambulatory Visit (HOSPITAL_BASED_OUTPATIENT_CLINIC_OR_DEPARTMENT_OTHER): Admit: 2016-08-08 | Discharge: 2016-08-08 | Disposition: A | Discharge status: Home / Self Care | Payer: Medicare Other

## 2016-08-09 NOTE — Progress Notes (Signed)
Faxed request to [facility], Radiology Dept., to obtain images of the MRI ABD by [employer] per RN.  Ph # [phone]  And  Fax  # [phone]    Deona Novitski(MA)

## 2016-08-14 NOTE — Progress Notes (Signed)
Received CD from [facility] on 08/14/16. CD contains MR Abdomen. DOS 08/08/16 and CT C/A/P  DOS 07/06/16. Uploaded and archived CD. Returned to [employer].      Annice Pih, MA II

## 2016-08-15 ENCOUNTER — Other Ambulatory Visit: Payer: Self-pay

## 2016-08-28 ENCOUNTER — Telehealth: Payer: Self-pay

## 2016-08-28 NOTE — Telephone Encounter (Signed)
-----   Message from Dannette Barbara, MD sent at 08/28/2016  7:32 PM PDT -----  Regarding: outside MRI scan  Hi Michele,    I have not seen MRI images for this patient (recent MRI was performed at [facility] on 08/08/16). Can you please check to see where we are in the process of requesting/ receiving the CD for the images? Thank you very much.    Mikki Santee

## 2016-08-29 ENCOUNTER — Other Ambulatory Visit: Payer: Self-pay

## 2016-08-29 DIAGNOSIS — C499 Malignant neoplasm of connective and soft tissue, unspecified: Principal | ICD-10-CM

## 2016-08-29 DIAGNOSIS — R160 Hepatomegaly, not elsewhere classified: Secondary | ICD-10-CM

## 2016-08-29 NOTE — Progress Notes (Signed)
Outside imaging for [place] review. Thank you very much.      Steve Margarito Dehaas, MD  Associate Professor  Surgical Oncology

## 2016-08-31 DIAGNOSIS — C499 Malignant neoplasm of connective and soft tissue, unspecified: Secondary | ICD-10-CM

## 2016-08-31 DIAGNOSIS — R160 Hepatomegaly, not elsewhere classified: Secondary | ICD-10-CM

## 2016-09-03 ENCOUNTER — Telehealth: Payer: Self-pay

## 2016-09-03 NOTE — Telephone Encounter (Signed)
Patient identified by name, DOB, and phone number.    Callers Name & Relationship: Pt    Phone Number & Best Time to Call: [phone]    Reason For Call: Pt called and stated he had his MRI done at [facility] about a month ago and has not rcvd his results    Action Requested: Please advise pt of his results     Thank you

## 2016-09-04 ENCOUNTER — Other Ambulatory Visit: Payer: Self-pay

## 2016-09-04 ENCOUNTER — Other Ambulatory Visit: Payer: Self-pay | Admitting: Family

## 2016-09-04 DIAGNOSIS — C499 Malignant neoplasm of connective and soft tissue, unspecified: Principal | ICD-10-CM

## 2016-09-04 DIAGNOSIS — Z7689 Persons encountering health services in other specified circumstances: Secondary | ICD-10-CM

## 2016-09-04 DIAGNOSIS — R932 Abnormal findings on diagnostic imaging of liver and biliary tract: Secondary | ICD-10-CM

## 2016-09-04 DIAGNOSIS — Z7902 Long term (current) use of antithrombotics/antiplatelets: Secondary | ICD-10-CM

## 2016-09-04 DIAGNOSIS — Z01818 Encounter for other preprocedural examination: Secondary | ICD-10-CM

## 2016-09-04 DIAGNOSIS — R160 Hepatomegaly, not elsewhere classified: Principal | ICD-10-CM

## 2016-09-04 NOTE — Progress Notes (Signed)
Biopsy request reviewed and protocoled with Dr. Estelle Grumbles.     Anderson Malta,    Please contact the patient to schedule an appointment on Friday, 9/29 if the patient would be available. Thank you.    Loney Loh, MSN, NP-BC  Nurse Practitioner II  Radiology  Department  Division of Body Imaging  PI: [phone]  [id number]  Pager: [phone]

## 2016-09-04 NOTE — Telephone Encounter (Signed)
Called patient to discuss MRI results, including review of outside images at [facility]. Findings are of unclear etiology, and there is concern for possible primary or secondary liver neoplasm/ malignancy.    I discussed with the patient recommendation for attempted biopsy. This is indicated to establish a diagnosis so more formal recommendations can be offered. I also discussed with him recommendation for biopsy procedure at [facility] for higher likelihood of achieving a definitive diagnosis with the procedure. I discussed with him the risk of a non-diagnostic results. He verbalized understanding. He is willing to proceed.    Referral to Radiology for image-guided biopsy to be placed.    [name] is comfortable with these recommendations and eager to proceed. He knows to contact me with questions or concerns.      Crist Infante, MD  Associate Professor  Surgical Oncology

## 2016-09-04 NOTE — Progress Notes (Signed)
Review/concur. Thank you very much.      Terril Mikel Hardgrove, MD  Associate Professor  Surgical Oncology

## 2016-09-04 NOTE — Progress Notes (Signed)
Referral for image-guided biopsy of suspicious liver mass.    Pre-procedure biochemical testing orders placed as well. Thank you very much.      Crist Infante, MD  Associate Professor  Surgical Oncology

## 2016-09-05 NOTE — Progress Notes (Signed)
Authorization for US guided liver biopsy is pending at this time. Once authorized, I will contact the patient to schedule.    Anderson Malta  [facility]  Radiology Scheduling   [phone]

## 2016-09-24 ENCOUNTER — Telehealth: Payer: Self-pay

## 2016-09-24 NOTE — Telephone Encounter (Signed)
Recommendation is for patient to have the biopsy procedure at [facility] given complexity of diagnosis and pathology evaluation. Thank you very much.    Order placed on 09/04/16. Thank you very much.      Crist Infante, MD  Associate Professor  Surgical Oncology

## 2016-09-24 NOTE — Telephone Encounter (Signed)
Callers Name & Relationship: pt    Phone Number & Best Time to Call: [phone], anytime    Reason For Call: Pt states he is to have biopsy done at [facility] for biopsy but has not heard back. Pt requesting call back to discuss.     Action Requested:Please Advise Caller.    Thank you

## 2016-09-25 NOTE — Telephone Encounter (Signed)
Insurance denied biopsy to be completed at [facility].    Called patient, verified ID x3. Discussed with patient biopsy referral faxed to [facility] yesterday and is ready for scheduling. Patient informed me he has already spoken with them and is scheduled for biopsy 10/03/16. I let him know once we receive the biopsy reports, Dr. [name] will call him to discuss.    He verbalized understanding and was appreciative of call.    Mahalia Longest, RN

## 2016-10-03 ENCOUNTER — Ambulatory Visit: Admit: 2016-10-03 | Discharge: 2016-10-03 | Disposition: A | Discharge status: Home / Self Care | Payer: Self-pay

## 2016-10-10 NOTE — Progress Notes (Signed)
Received CD from [facility] on 10/10/2016. CD contains US abdomen Limited. DOS 10/03/2016. Uploaded and archived CD. Krista Blue, MA

## 2016-10-11 ENCOUNTER — Telehealth: Payer: Self-pay

## 2016-10-11 ENCOUNTER — Other Ambulatory Visit: Payer: Self-pay

## 2016-10-11 DIAGNOSIS — Z85831 Personal history of malignant neoplasm of soft tissue: Secondary | ICD-10-CM | POA: Insufficient documentation

## 2016-10-11 DIAGNOSIS — C499 Malignant neoplasm of connective and soft tissue, unspecified: Principal | ICD-10-CM

## 2016-10-11 DIAGNOSIS — R160 Hepatomegaly, not elsewhere classified: Principal | ICD-10-CM

## 2016-10-11 NOTE — Telephone Encounter (Signed)
Patient's liver biopsy was redirected to [facility]. They scheduled the procedure for 10/03/16. On the day of the procedure, they failed to complete the study because of technical issues.    My recommendation to the patient is PET/CT scan given suspicion of malignancy and laparoscopic liver biopsy using intraoperative ultrasound. The patient's past surgical history with gastric bypass and prior ventral hernia will increase complexity of laparoscopic procedure, but think this is the most expeditious way to obtain definitive diagnosis and formulate treatment recommendations.     I discussed with the patient the risks/benefits and technical aspects of laparoscopic liver biopsy. He verbalized understanding. He was thankful for call.    Patient will need preoperative appointment before biopsy procedure.     [name] is comfortable with these recommendations and eager to proceed. He knows to contact me with questions or concerns.      Crist Infante, MD  Associate Professor  Surgical Oncology

## 2016-10-11 NOTE — Progress Notes (Signed)
See phone encounter for further details. Surgical case request for OR on 10/24/16.    Please schedule patient for preoperative appointment, including blood work, before OR 10/24/16. Thank you very much.      Crist Infante, MD  Associate Professor  Surgical Oncology

## 2016-10-12 ENCOUNTER — Other Ambulatory Visit: Payer: Self-pay

## 2016-10-15 ENCOUNTER — Telehealth: Payer: Self-pay

## 2016-10-15 NOTE — Telephone Encounter (Signed)
Patient called me to inform me that Louis Taylor said they will call him in 24 hours to schedule.

## 2016-10-15 NOTE — Telephone Encounter (Signed)
Left a message for the patient to call back and confirm pre op 10/22/16 @ 1 & surgery 10/24/16. And to notify the patient that his PET/CT will be @ [facility] [place]. They will call to schedule him.

## 2016-10-15 NOTE — Telephone Encounter (Signed)
Patient returned my call, IDx3. Confirmed pre op 10/22/16 @ 1 & surgery 10/24/16. I also informed him that his PET/CT has been approved & it will be performed @ [facility] @ [phone]. I informed him that the auth for surgery, pre op, & pre op testing are pending. He will call back to inform us of his PET/CT appointment

## 2016-10-17 NOTE — Pre-Op/Pre-Procedure Screening (Signed)
Pre-op scheduled with Surgical Oncology 10/22/16.    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    PSH:  Past Surgical History:   Procedure Laterality Date    ARTHROPLASTY, KNEE, TOTAL Right 2007    GASTRECTOMY, SLEEVE, LAPAROSCOPIC  2014    PR ADJ TISS XFER ANY AREA,30.1-60 SQCM  12/08/2014         PR ADJ TISS XFER ANY AREA,EA ADD 30.0 SQCM  12/08/2014         PR RAD RESECTION TUMOR SOFT TIS THIGH/KNEE 5 CM/>  12/08/2014         REPAIR, HERNIA, OPEN  2013     Anesthetic Hx: Denies anesthesia complications. Denies family history of anesthesia complications. Denies history of malignant hyperthermia.    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 NKDA.  Denies fever, chills, nausea, vomiting, new rashes and new illness.  Airway   Mallampati: I  TM distance: >3 FB   Neck ROM is full. Dental    (+) edentulous     Airway MD; P3 OR; 12/08/14; 1105; 1; Pre-oxygenation; 5; laryngeal mask airway, cuffed; 12/08/14; 1349 12/08/14 1105   Anesthetic complications: no    Allergies: No Known Allergies    Medications:   Outpatient Prescriptions Marked as Taking for the 10/24/16 encounter [facility] Encounter)   Medication Sig Dispense Refill    ASPIRIN PO Take 81 mg by mouth every day.      aspirin-acetaminophen-caffeine 250-250-65 mg Tablet Take 65 mg by mouth if needed.      ATORVASTATIN CALCIUM (LIPITOR PO) Take 40 mg by mouth every day at bedtime. Take at 6pm every night.      LEVOTHYROXINE SODIUM (SYNTHROID PO) Take 137 mg by mouth every morning.      MULTIVITAMIN PO Take 1 capsule by mouth.      VITAMIN D-3 PO Take 500 mg by mouth 2 times daily, before morning and evening meals.       Other medications reviewed:  Nutritional supplements: Yes,pls see above   Herbal Supplements: Denies  Inhalers/Nasal Sprays/eye drops: Denies  Topical creams/Ointments: Denies  OTC Pain medications/other  unlisted medications: Denies     -I have reviewed the medication list with the patient using [employer] and compared it with the current EMR data.           Drug/Alcohol Hx:  History   Smoking Status    Former Smoker    Packs/day: 1.00    Years: 10.00    Types: Cigarettes   Smokeless Tobacco    Never Used     Comment: approx 10 pack years, quit 30 years ago     History   Alcohol Use    1.2 - 1.8 oz/week    0 - 1 Standard drinks or equivalent, 2 Shots of liquor per week     Comment: occasional     History   Drug Use No     CV Tests:    12/07/14 Normal sinus rhythm  Right bundle branch block  Left anterior fascicular block  Bifascicular block   Abnormal ECG      Labs:   CBC:     Lab Results  Component Value Date/Time   WHITE BLOOD CELL COUNT 6.2 11/15/2014 1542   RED CELL COUNT 3.97  (L) 11/15/2014 1542   HEMOGLOBIN 12.5 (L) 11/15/2014 1542   HEMATOCRIT 36.8 (L) 11/15/2014 1542   MCV 92.8 11/15/2014 1542   MCH 31.5 11/15/2014 1542   RDW 14.0 11/15/2014 1542   PLATELET COUNT 240 11/15/2014 1542   PLATELET ESTIMATE, SMEAR NORMAL 11/15/2014 1542       BMP/CMP:    Lab Results  Component Value Date/Time   SODIUM 141 11/15/2014 1542   POTASSIUM 4.3 11/15/2014 1542   CHLORIDE 108 11/15/2014 1542   CARBON DIOXIDE TOTAL 25 11/15/2014 1542   CREATININE BLOOD 0.85 11/15/2014 1542   E-GFR, AFRICAN AMERICAN >60 11/15/2014 1542   E-GFR, NON-AFRICAN AMERICAN >60 11/15/2014 1542   UREA NITROGEN, BLOOD (BUN) 13 11/15/2014 1542   GLUCOSE 94 11/15/2014 1542   CALCIUM 9.5 11/15/2014 1542       Lab Results  Component Value Date/Time   PROTEIN 6.8 11/15/2014 1542   ALBUMIN 4.2 11/15/2014 1542   ALKALINE PHOSPHATASE (ALP) 46 11/15/2014 1542   ASPARTATE TRANSAMINASE (AST) 23 11/15/2014 1542   BILIRUBIN TOTAL 0.7 11/15/2014 1542   ALANINE TRANSFERASE (ALT) 25 11/15/2014 1542     Coag Panel:    Lab Results  Component Value Date/Time   INR 0.94 11/15/2014 1542     Type and Screen:     Lab Results  Component Value Date/Time   PATIENT BLOOD TYPE O NEGATIVE 11/15/2014 1542     ROS:  CV: Denies MI. Denies any chest pain/pressure, palpitations, DOE, PND, orthopnea, dizziness or edema.  Pt. is able to lie flat without difficulty breathing.   Resp: Denies pulmonary disease, asthma, sleep apnea, URI or history of pneumonia  Neuro/Psych: Denies CVA, TIA, seizures, neurologic disease, or psychiatric illness  Musculoskeletal: +arthiritis knees, shoulders, hands, fingers +right knee replacement Denies muscle, bone or connective tissue disease. Denies implanted prosthetics  Med: +CA (LLE Leiomyosarcoma) no chemo/radiation,+hypothyroidism +Obesity Denies GERD, liver,or  kidney disease. Denies diabetes - diabetes resolved after Lap. Gastric Sleeve surgery.  Denies blood dyscrasia/ coagulopathy. Denies recent antibiotic use,  hospitalization or ED visit. Agrees to blood transfusion if needed.     Activity: no regular exercise, limited by knee pain and body habitus.  Cares for wife (stroke victim) for >20 years.     PE: (per patient report)  Airway:  Denies jaw/TMJ problems. + full dentures   Denies  anything in his mouth that is easily removable.    Neck:  Denies pain in the neck/radicular symptoms with extension and flexion.    Vitals:  Temp: 37.3 C (99.2 F) (06/25/2016  2:22 PM)  Temp src: Oral (06/25/2016  2:22 PM)  Pulse: 81 (06/25/2016  2:22 PM)  BP: 123/72 (06/25/2016  2:22 PM)  Resp: 16 (06/25/2016  2:22 PM)  SpO2: 97 % (06/25/2016  2:22 PM)  Height: 1.727 m (5\' 8" ) (06/25/2016  2:22 PM)  Weight: 135 kg (297 lb 9.9 oz) (06/25/2016  2:22 PM)   Estimated body mass index is 45.25 kg/(m^2) as calculated from the following:    Height as of 06/25/16: 1.727 m (5\' 8" ).    Weight as of 06/25/16: 135 kg (297 lb 9.9 oz).      No LMP for male patient.      Pt. Instructions: Please follow fasting guidelines as per Surgical Oncology Clinic.    Please hold all NSAIDS (2-5 days) and Aspirin (5-7 days) unless specified by a Doctor.    Patient will hold Aspirin starting 10/19/16.      Please stop all herbs, minerals, supplements and vitamins one week before surgery.    Acetaminophen (Tylenol) or prescribed narcotic medications may be taken for pain.    Medications to take the morning of surgery: Levothyroxine    You will receive a phone call from a Preop Nurse 1-2 business days before your surgery date to confirm your surgery time, arrival time and location. The Nurse will also review fasting guidelines.     Patient verbalized understanding and had opportunity to ask questions.     Cyndie Mull, RN, [facility]  [phone]

## 2016-10-18 NOTE — Telephone Encounter (Signed)
[facility] Radiology. Patient scheduled for scan Friday 10/19/16. Will request results Monday.    Mahalia Longest, RN

## 2016-10-19 ENCOUNTER — Ambulatory Visit: Admit: 2016-10-19 | Discharge: 2016-10-19 | Disposition: A | Discharge status: Home / Self Care | Payer: Self-pay

## 2016-10-22 ENCOUNTER — Telehealth: Payer: Self-pay

## 2016-10-22 ENCOUNTER — Ambulatory Visit: Payer: Medicare Other

## 2016-10-22 NOTE — Telephone Encounter (Signed)
Dr. Tasia Catchings contacted me regarding authorization for surgery on Weds. She verbalized understanding of the rationale and justification for the surgery. She verbalized understanding that it seems reasonable to approve. She will have authorization coordinator contact [facility] on 11/14 to provide authorization number.    Please try to have procedure scheduled for 11/15 since patient has already experienced significant delay in his procedure and I am not available for this procedure for several weeks because of time away, other cases, etc... Thank you very much.      Crist Infante, MD  Associate Professor  Surgical Oncology

## 2016-10-22 NOTE — Telephone Encounter (Signed)
Called number provided regarding denial of liver biopsy. No answer, left message requesting return call.    Informed them that patient's delay in diagnosis is directly related, in my judgement, to these ongoing delays and denials by the insurance company regarding authorization for percutaneous biopsy at [facility] and now laparoscopic liver biopsy at [facility].     Have previously not received return calls from this number, so also indicated that in my message that authorization needed before biopsy procedure on Wednesday.      Crist Infante, MD  Associate Professor  Surgical Oncology

## 2016-10-22 NOTE — Telephone Encounter (Signed)
Requested PET report and imaging CD from [facility] Radiology. Patient's scan scheduled 10/19/16.    Mahalia Longest, RN

## 2016-10-22 NOTE — Telephone Encounter (Signed)
Called patient, verified ID x3. Informed him again that we have left messages with insurance company appealing the denial for pre-operative visit as well as biopsy procedure scheduled for 11/15 with Dr. [name]. Verified patient is aware we are having to cancel his pre-op appointment schedule for today.    We discussed Dr. [name] has spoken with Dr. Beatris Si, patient's PCP, about the current insurance denial. I encouraged him to call Dr. Jennette Banker office today to schedule a follow up appointment regarding needed liver biopsy.    Patient verbalized understanding and was appreciative of follow-up. He stated he will call Dr. Jennette Banker office for follow up at this time.    Mahalia Longest, RN

## 2016-10-22 NOTE — Telephone Encounter (Signed)
Spoke to the patient, IDx3. Informed him that. His insurance DENIED his surgery & pre op visit. I told him not to come to [facility] yet, and I will call him and let him no if we got the denial overturned. He would like to know the results of the PET/CT.

## 2016-10-22 NOTE — Telephone Encounter (Signed)
Called patient's PCP to ensure that he was aware of patient's denial for liver biopsy at [facility] this week. Discussed with him that denial letter is redirecting the patient to his PCP for evaluation and management recommendations regarding liver mass suspicious for malignancy/ suspicious for metastatic leiomyosarcoma.     Dr. Beatris Si indicated that he is happy to see the patient for follow up consultation and refer patient to [facility] for evaluation and management regarding suspicious liver mass since that appears appropriate given rarity of soft tissue sarcoma and complexity of diagnosis and management of leiomyosarcoma.     Number for Dr. Beatris Si is [phone].      Crist Infante, MD  Associate Professor  Surgical Oncology

## 2016-10-22 NOTE — Telephone Encounter (Signed)
Called patient to inform him of the status of his liver biopsy procedure for 11/15. Informed that the procedure was denied by his insurance company. Informed him of my significant concerns regarding the delays in his care because of insurance company issues (redirecting initial biopsy to [facility] where it was not performed, etc...) I informed him that the official position of the insurance company is that they are not directing care, but to proceed with the biopsy procedure without authorization exposes him to the financial responsibility of the biopsy procedure and associated charges.    I indicated very clearly that I am concerned about the significant risks of delay in diagnosis and progression of disease 2/2 these insurance company denials. I encouraged him to contact his insurance company and strongly insist on reversal of this potentially problematic decision given the risk of delay in diagnosis and progression of disease. He verbalized understanding. He was thankful for call.      Crist Infante, MD  Associate Professor  Surgical Oncology

## 2016-10-22 NOTE — Telephone Encounter (Signed)
-----  Message from Michele Santos, RN sent at 10/22/2016  8:22 AM PST -----  Regarding: FW: Surgery DENIED  Hi Abby,    Thank you for helping out with appealing the denial! This message has the info for his biopsy procedure.    Michele    ----- Message -----     From: Michelle Pele, [facility]     Sent: 10/22/2016   7:12 AM       To: Michele Santos, RN, Rathana Jonathan Evonda Enge, MD  Subject: Surgery DENIED                                   Per [facility]; We denied the medical services/items listed above because:  The service requested was reviewed by our physician reviewer. The medical documentation received does not support the need for this service because you have not met [facility] ([facility]) "Tertiary Center Referral Process for Medical or Surgical Consultations" for this liver biopsy (a procedure in which a small needle is inserted into the liver to collect a tissue sample for examination with a microscope). A tertiary care provider is a doctor/hospital used by [facility] when medically necessary services are not available in the [facility] network of doctors. Based on the records received, you have a liver mass. You are asking for a liver biopsy. You are being seen by a [place] general surgeon (doctor with special training in surgery). [facility] is a tertiary care provider. Your records do not show that you have an immediate emergency treatment need. Before we can approve services with a tertiary care provider, you need to have a full work-up by in-plan doctors first If the treating physician would like to discuss this case with the physician or health care professional reviewer or obtain a copy of the criteria used to make this decision, please call Dr. Irene Lo at [phone]. You will be prompted to leave a detailed message so that we can research and coordinate your return call appropriately. PLEASE NOTE THAT THIS NUMBER IS NOT INTENDED FOR PATIENT INQUIRIES. PLEASE DO NOT GIVE THIS PHONE NUMBER TO THE PATIENT.   Please  advice

## 2016-10-22 NOTE — Telephone Encounter (Signed)
Have not seen PET/CT images or report as yet. Thank you very much.      Crist Infante, MD  Associate Professor  Surgical Oncology

## 2016-10-22 NOTE — Telephone Encounter (Signed)
-----  Message from Mahalia Longest, RN sent at 10/22/2016  8:22 AM PST -----  Regarding: FW: Surgery DENIED  Hi Abby,    Thank you for helping out with appealing the denial! This message has the info for his biopsy procedure.    Selinda Eon    ----- Message -----     From: Lorane Gell, [place]     Sent: 10/22/2016   7:12 AM       To: Mahalia Longest, RN, Dannette Barbara, MD  Subject: Surgery DENIED                                   Per [facility] Physician; We denied the medical services/items listed above because:  The service requested was reviewed by our physician reviewer. The medical documentation received does not support the need for this service because you have not met [facility]) "[facility] Referral Process for Medical or Surgical Consultations" for this liver biopsy (a procedure in which a small needle is inserted into the liver to collect a tissue sample for examination with a microscope). A tertiary care provider is a doctor/hospital used by [facility] when medically necessary services are not available in the [facility] network of doctors. Based on the records received, you have a liver mass. You are asking for a liver biopsy. You are being seen by a [place] general surgeon (doctor with special training in surgery). [facility] is a tertiary care provider. Your records do not show that you have an immediate emergency treatment need. Before we can approve services with a tertiary care provider, you need to have a full work-up by in-plan doctors first If the treating physician would like to discuss this case with the physician or health care professional reviewer or obtain a copy of the criteria used to make this decision, please call Dr. Almedia Balls at [phone]. You will be prompted to leave a detailed message so that we can research and coordinate your return call appropriately. PLEASE NOTE THAT THIS NUMBER IS NOT INTENDED FOR PATIENT INQUIRIES. PLEASE DO NOT GIVE THIS PHONE NUMBER TO THE PATIENT.   Please  advice

## 2016-10-23 ENCOUNTER — Telehealth: Payer: Self-pay

## 2016-10-23 ENCOUNTER — Other Ambulatory Visit: Payer: Self-pay

## 2016-10-23 DIAGNOSIS — C499 Malignant neoplasm of connective and soft tissue, unspecified: Principal | ICD-10-CM

## 2016-10-23 DIAGNOSIS — R160 Hepatomegaly, not elsewhere classified: Secondary | ICD-10-CM

## 2016-10-23 NOTE — Progress Notes (Signed)
Surgical case request for previously scheduled OR case on 10/24/16. Insurance authorization to be provided today. Thank you very much.      Crist Infante, MD  Associate Professor  Surgical Oncology

## 2016-10-23 NOTE — Telephone Encounter (Addendum)
Review/concur. Thank you very much.    Patient will need to be consented and preoperative evaluation completed same day as surgical procedure.      Crist Infante, MD  Associate Professor  Surgical Oncology

## 2016-10-23 NOTE — Telephone Encounter (Signed)
Spoke to the patient, IDx3. Confirmed surgery 10/24/16. I gave him the # to pre op anesthesia and advised him to call if he does not hear from them by 1pm today for pre op instructions.

## 2016-10-23 NOTE — Anesthesia Preprocedure Evaluation (Addendum)
Anesthesia Evaluation    History of Present Illness  Mr. Barbush is a 70 year old gentleman with past medical history significant for LLE leiomyoscaroma now with liver mass presenting for laparoscopic/ultrasound guided liver biopsy with Dr. [name]    PMH: PE with ARF and sepsis after gastric surgery (4 years ago), GERD, well controlled DM, obesity, thyroid dysfunction, paraesthesias in right 3rd and 4th fingers, hypothryoidism  P Surg hx: Sleeve gastrectomy, excision mass thigh  ALL:   Home meds: ASA, atorvastatin, CaCO3, levothyroxine, omeprazole, vitamin D  Airway   Mallampati: III     Neck ROM is full.     Dental    (+) edentulous   Pulmonary - normal exam  (-) COPD, asthma, rales, decreased breath sounds, wheezes    Pulmonary ROS comment: Hx of PE following sleeve gastrectomy, no longer on anticoagulation, no oxygen needed   Quit tob 30 years ago Cardiovascular   (-) hypertension, past MI, CABG/stent, CHF, murmur, weak pulses, peripheral edema    ECG reviewed  Rhythm: regular  Rate: normal  Cardiovascular ROS comment: Normal sinus rhythm  Right bundle branch block  Left anterior fascicular block    Patient has poor exercise tolerance. (Exercise limited by L knee pain however cares for his wife )   Neuro/Psych - negative for neuro conditions with ROS    GI/Hepatic/Renal   (+) GERD (well controlled), liver disease (Liver mass here for bx),         Comments: S/p gastrectomy   Endo/Other    (+) diabetes mellitus (resolved after gastric bypass),     Comments: hypothyroidism,                        Anesthesia Plan    ASA 3     general   Zyheem Traywick is a 49yr male presenting today for ultrasound guided liver biopsy as an ASA. Following a pre-operative examination and full consenting they have been cleared to undergo general anesthesia with endotracheal tube placement.    Actual weight: Weight: 135  Ideal weight: Ideal body weight: 68.4 kg (150 lb 12.7 oz)  Adjusted ideal body weight: 95 kg (209 lb 8.4  oz)    Anesthesia plan:  1. Access: 1 PIV placed in pre-op, with second IV to be placed in OR if necessary  2. Blood: Type and screen sent. Do not anticipate blood transfusion.   3. Monitoring: ASA recommended intraoperative monitoring including BP, SpO2, EKG, Temp, and EtCO2 along with nerve stimulator to monitor relaxation if neuromuscular blockade administered.   4. Neuraxial/regional: Not applicable  5. Airway: Direct laryngoscopy with Mac 4, 7.5 ETT  6. Premedication: Midazolam 1-2mg  IV or dexmedetomidine 10-20 mcg prn prior to leaving pre-op   7. Induction: Lidocaine, fentanyl, propofol  8. Relaxation: Rocuronium  9. Fluid plan: Goal directed fluid therapy titrated to clinical picture  10. Antibiotic: Cefazolin 3g IV or other antibiotic per surgeon preference prior to incision  11. Maintenance: Volatile anesthetic  12. Emergence: Reversal of relaxation with glycopyrrolate and neostigmine or sugammadex as indicated  13. Post-op pain: As per PACU orders, acetaminophen, fentanyl, hydromorphone  14. Special considerations: None  15. Emergency drugs on hand: phenylephrine, ephedrine, atropine, succinylcholine     )  intravenous induction   Anesthetic plan and risks discussed with patient.  I personally performed a physical assessment on this patient.

## 2016-10-23 NOTE — Telephone Encounter (Signed)
Received transferred call, verified patient's ID x3. Patient informed RN he took 2 aspirin this morning for knee pain. Last prior aspirin over 1 week prior, no ibuprofen, no fish oil or other supplements. I discussed with Dr. [name], ok to proceed with biopsy tomorrow 11/14. Informed patient he is ok to have biopsy tomorrow, consent will be signed in pre-op and any additional pre-operative orders/studies will be completed at that time.    Patient verbalized understanding and was very appreciative of call with clarification. No additional questions.    Mahalia Longest, RN

## 2016-10-23 NOTE — Pre-Op/Pre-Procedure Screening (Signed)
Pre-Op Phone Call Documentation    Pre op call with surgery information for 10/24/16. During interview, pt revealed that he had been holding ASA in preparation for OR. Then he informed me that he took an aspirin this morning. I informed him that he needs to call the clinic, Let them know about the aspirin and find out what they want him to do.    Verification of planned surgery times  Surgery Date/Time Verified: Yes  Arrival Time Verified: 1230  Surgery Location Verified: [facility]    Verification of NPO Status  Verified NPO Clear Liquids Time: 1030  Verified NPO Clear Liquids Date: 10/24/16  Verified NPO Solids Time: 2359  Verified NPO Solids Date: 10/23/16     Post surgical discharge information   Person to pick up pt (if different than support person): Phil  Phone number of person picking up patient: [phone]    Brandice Busser L Jenisha Faison, RN  10/23/1708:51

## 2016-10-24 ENCOUNTER — Telehealth: Payer: Self-pay

## 2016-10-24 ENCOUNTER — Observation Stay (EMERGENCY_DEPARTMENT_HOSPITAL): Payer: Medicare Other

## 2016-10-24 ENCOUNTER — Encounter: Admission: RE | Disposition: A | Discharge status: Home / Self Care | Payer: Self-pay | Source: Ambulatory Visit

## 2016-10-24 ENCOUNTER — Ambulatory Visit: Payer: Medicare Other | Admitting: Anesthesiology

## 2016-10-24 ENCOUNTER — Ambulatory Visit (HOSPITAL_BASED_OUTPATIENT_CLINIC_OR_DEPARTMENT_OTHER): Payer: Medicare Other | Admitting: Anesthesiology

## 2016-10-24 ENCOUNTER — Ambulatory Visit
Admission: RE | Admit: 2016-10-24 | Discharge: 2016-10-24 | Disposition: A | Discharge status: Home / Self Care | Payer: Medicare Other | Source: Ambulatory Visit

## 2016-10-24 ENCOUNTER — Ambulatory Visit
Admission: RE | Admit: 2016-10-24 | Discharge: 2016-10-25 | DRG: 436 | Disposition: A | Discharge status: Home / Self Care | Payer: Medicare Other | Source: Ambulatory Visit

## 2016-10-24 ENCOUNTER — Ambulatory Visit: Payer: Medicare Other

## 2016-10-24 DIAGNOSIS — Z96651 Presence of right artificial knee joint: Secondary | ICD-10-CM | POA: Insufficient documentation

## 2016-10-24 DIAGNOSIS — C224 Other sarcomas of liver: Principal | ICD-10-CM | POA: Insufficient documentation

## 2016-10-24 DIAGNOSIS — K769 Liver disease, unspecified: Secondary | ICD-10-CM

## 2016-10-24 DIAGNOSIS — C229 Malignant neoplasm of liver, not specified as primary or secondary: Secondary | ICD-10-CM

## 2016-10-24 DIAGNOSIS — Z6841 Body Mass Index (BMI) 40.0 and over, adult: Secondary | ICD-10-CM | POA: Insufficient documentation

## 2016-10-24 DIAGNOSIS — C499 Malignant neoplasm of connective and soft tissue, unspecified: Secondary | ICD-10-CM

## 2016-10-24 DIAGNOSIS — E669 Obesity, unspecified: Secondary | ICD-10-CM | POA: Insufficient documentation

## 2016-10-24 DIAGNOSIS — Z85831 Personal history of malignant neoplasm of soft tissue: Secondary | ICD-10-CM | POA: Insufficient documentation

## 2016-10-24 DIAGNOSIS — E039 Hypothyroidism, unspecified: Secondary | ICD-10-CM | POA: Insufficient documentation

## 2016-10-24 DIAGNOSIS — Z7982 Long term (current) use of aspirin: Secondary | ICD-10-CM | POA: Insufficient documentation

## 2016-10-24 DIAGNOSIS — E785 Hyperlipidemia, unspecified: Secondary | ICD-10-CM | POA: Insufficient documentation

## 2016-10-24 DIAGNOSIS — R160 Hepatomegaly, not elsewhere classified: Secondary | ICD-10-CM

## 2016-10-24 DIAGNOSIS — R918 Other nonspecific abnormal finding of lung field: Secondary | ICD-10-CM

## 2016-10-24 DIAGNOSIS — Z9884 Bariatric surgery status: Secondary | ICD-10-CM | POA: Insufficient documentation

## 2016-10-24 HISTORY — PX: PR BX LVR NDL DONE PURPOSE TM OTH MAJOR PX: 47001

## 2016-10-24 HISTORY — PX: PR LAPS ABD PRTM&OMENTUM DX W/WO SPEC BR/WA SPX: 49320

## 2016-10-24 HISTORY — PX: CHG US GUIDANCE NEEDLE PLACEMENT IMG S&I: 76942

## 2016-10-24 LAB — CBC WITH DIFFERENTIAL
BASOPHILS % AUTO: 0.5 %
BASOPHILS ABS AUTO: 0 10*3/uL (ref 0.0–0.2)
EOSINOPHIL % AUTO: 0.4 %
EOSINOPHIL ABS AUTO: 0 10*3/uL (ref 0.0–0.5)
HEMATOCRIT: 33 % — AB (ref 41.0–53.0)
HEMOGLOBIN: 11.2 g/dL — AB (ref 13.5–17.5)
LYMPHOCYTE ABS AUTO: 0.7 10*3/uL — AB (ref 1.0–4.8)
LYMPHOCYTES % AUTO: 9.3 %
MCH: 32.2 pg (ref 27.0–33.0)
MCHC: 34 % (ref 32.0–36.0)
MCV: 94.7 UM3 (ref 80.0–100.0)
MONOCYTES % AUTO: 8.4 %
MONOCYTES ABS AUTO: 0.6 10*3/uL (ref 0.1–0.8)
MPV: 7.3 UM3 (ref 6.8–10.0)
NEUTROPHIL ABS AUTO: 6.2 10*3/uL (ref 1.8–7.7)
NEUTROPHILS % AUTO: 81.4 %
PLATELET COUNT: 182 10*3/uL (ref 130–400)
RDW: 13.6 U (ref 0.0–14.7)
Red Blood Cell Count: 3.49 10*6/uL — ABNORMAL LOW (ref 4.50–5.90)
WHITE BLOOD CELL COUNT: 7.6 10*3/uL (ref 4.5–11.0)

## 2016-10-24 LAB — HEMATOCRIT: HEMATOCRIT: 37.1 % — AB (ref 41.0–53.0)

## 2016-10-24 LAB — TYPE AND SCREEN
ANTIBODY SCREEN: NEGATIVE
PATIENT BLOOD TYPE: O NEG

## 2016-10-24 LAB — HEMOGLOBIN: Hemoglobin: 12.6 g/dL — ABNORMAL LOW (ref 13.5–17.5)

## 2016-10-24 LAB — HIV RAPID TEST: HIV RAPID TEST: NONREACTIVE

## 2016-10-24 SURGERY — BIOPSY, LIVER, WITH US GUIDANCE
Anesthesia: General | Site: Abdomen | Wound class: Clean

## 2016-10-24 SURGERY — BIOPSY, LIVER, WITH US GUIDANCE
Anesthesia: General

## 2016-10-24 MED ORDER — OMEPRAZOLE 20 MG CAPSULE,DELAYED RELEASE
20.0000 mg | DELAYED_RELEASE_CAPSULE | Freq: Every day | ORAL | Status: DC
Start: 2016-10-24 — End: 2016-10-24

## 2016-10-24 MED ORDER — PHENYLEPHRINE 10 MG/ML INJECTION SOLUTION
20000.0000 ug | INTRAMUSCULAR | Status: DC | PRN
Start: 2016-10-24 — End: 2016-10-24
  Administered 2016-10-24: .1 ug via INTRAVENOUS

## 2016-10-24 MED ORDER — FENTANYL (PF) 50 MCG/ML INJECTION SOLUTION
25.0000 ug | INTRAMUSCULAR | Status: DC | PRN
Start: 2016-10-24 — End: 2016-10-24

## 2016-10-24 MED ORDER — LEVOTHYROXINE 137 MCG TABLET
137.0000 ug | ORAL_TABLET | Freq: Every day | ORAL | Status: DC
Start: 2016-10-25 — End: 2016-10-25
  Administered 2016-10-25: 137 ug via ORAL
  Filled 2016-10-24: qty 1

## 2016-10-24 MED ORDER — ELECTROLYTE-A INTRAVENOUS SOLUTION
INTRAVENOUS | Status: DC | PRN
Start: 2016-10-24 — End: 2016-10-24
  Administered 2016-10-24: 17:00:00 via INTRAVENOUS

## 2016-10-24 MED ORDER — ROCURONIUM 100 MG/10 ML (10 MG/ML) INTRAVENOUS SYRINGE
INJECTION | INTRAVENOUS | Status: DC | PRN
Start: 2016-10-24 — End: 2016-10-24
  Administered 2016-10-24: 50 mg via INTRAVENOUS

## 2016-10-24 MED ORDER — ONDANSETRON HCL (PF) 4 MG/2 ML INJECTION SOLUTION
4.0000 mg | INTRAMUSCULAR | Status: DC | PRN
Start: 2016-10-24 — End: 2016-10-24

## 2016-10-24 MED ORDER — ACETAMINOPHEN 325 MG TABLET
650.0000 mg | ORAL_TABLET | ORAL | Status: DC | PRN
Start: 2016-10-24 — End: 2016-10-25

## 2016-10-24 MED ORDER — HYDROMORPHONE 1 MG/ML INJECTION SYRINGE
0.2000 mg | INJECTION | INTRAMUSCULAR | Status: DC | PRN
Start: 2016-10-24 — End: 2016-10-24

## 2016-10-24 MED ORDER — INTRAOP DEXMEDETOMIDINE 100 MCG/ML IV BOLUS DOSE
Status: DC | PRN
Start: 2016-10-24 — End: 2016-10-24
  Administered 2016-10-24: 10 ug via INTRAVENOUS

## 2016-10-24 MED ORDER — INTRAOP STERILE WATER 1000 ML IRRIGATION BOTTLE
Status: DC | PRN
Start: 2016-10-24 — End: 2016-10-24
  Administered 2016-10-24: 1000 mL

## 2016-10-24 MED ORDER — EPHEDRINE SULFATE 50 MG/ML INJECTION SOLUTION
INTRAMUSCULAR | Status: DC | PRN
Start: 2016-10-24 — End: 2016-10-24
  Administered 2016-10-24: 5 mg via INTRAVENOUS

## 2016-10-24 MED ORDER — LACTATED RINGERS IV INFUSION
INTRAVENOUS | Status: DC
Start: 2016-10-24 — End: 2016-10-24
  Administered 2016-10-24 (×2): via INTRAVENOUS

## 2016-10-24 MED ORDER — PANTOPRAZOLE 20 MG TABLET,DELAYED RELEASE
20.0000 mg | DELAYED_RELEASE_TABLET | Freq: Every day | ORAL | Status: DC
Start: 2016-10-24 — End: 2016-10-25
  Administered 2016-10-24: 20 mg via ORAL
  Filled 2016-10-24: qty 1

## 2016-10-24 MED ORDER — CEFAZOLIN IV PUSH ONCALL TO OR
3.0000 g | INTRAMUSCULAR | Status: AC
Start: 2016-10-24 — End: 2016-10-24
  Administered 2016-10-24: 3 g via INTRAVENOUS

## 2016-10-24 MED ORDER — CALCIUM 200 MG (AS CALCIUM CARBONATE 500 MG) CHEWABLE TABLET
500.0000 mg | CHEWABLE_TABLET | Freq: Four times a day (QID) | ORAL | Status: DC | PRN
Start: 2016-10-24 — End: 2016-10-25
  Administered 2016-10-24: 500 mg via ORAL
  Filled 2016-10-24: qty 1

## 2016-10-24 MED ORDER — FENTANYL (PF) 50 MCG/ML INJECTION SOLUTION
INTRAMUSCULAR | Status: DC | PRN
Start: 2016-10-24 — End: 2016-10-24
  Administered 2016-10-24: 25 ug via INTRAVENOUS
  Administered 2016-10-24: 50 ug via INTRAVENOUS
  Administered 2016-10-24 (×2): 25 ug via INTRAVENOUS
  Administered 2016-10-24 (×2): 50 ug via INTRAVENOUS
  Administered 2016-10-24: 100 ug via INTRAVENOUS
  Administered 2016-10-24: 25 ug via INTRAVENOUS
  Administered 2016-10-24: 50 ug via INTRAVENOUS

## 2016-10-24 MED ORDER — METOCLOPRAMIDE 5 MG/ML INJECTION SOLUTION
10.0000 mg | Freq: Four times a day (QID) | INTRAMUSCULAR | Status: DC | PRN
Start: 2016-10-24 — End: 2016-10-25

## 2016-10-24 MED ORDER — ONDANSETRON HCL (PF) 4 MG/2 ML INJECTION SOLUTION
INTRAMUSCULAR | Status: DC | PRN
Start: 2016-10-24 — End: 2016-10-24
  Administered 2016-10-24: 4 mg via INTRAVENOUS

## 2016-10-24 MED ORDER — BUPIVACAINE (PF) 0.5 % (5 MG/ML) INJECTION SOLUTION
INTRAMUSCULAR | Status: DC | PRN
Start: 2016-10-24 — End: 2016-10-24
  Administered 2016-10-24: 13 mL
  Administered 2016-10-24: 7 mL

## 2016-10-24 MED ORDER — PHENYLEPHRINE (PF) 1 MG/10 ML (100 MCG/ML) IN 0.9 % NACL IV SYRINGE
INJECTION | INTRAVENOUS | Status: DC | PRN
Start: 2016-10-24 — End: 2016-10-24
  Administered 2016-10-24 (×4): 100 ug via INTRAVENOUS

## 2016-10-24 MED ORDER — PROPOFOL BOLUS - PEDS
INTRAVENOUS | Status: DC | PRN
Start: 2016-10-24 — End: 2016-10-24
  Administered 2016-10-24: 50 mg via INTRAVENOUS
  Administered 2016-10-24: 100 mg via INTRAVENOUS
  Administered 2016-10-24: 50 mg via INTRAVENOUS

## 2016-10-24 MED ORDER — LIDOCAINE HCL 20 MG/ML (2 %) INJECTION SOLUTION
INTRAMUSCULAR | Status: DC | PRN
Start: 2016-10-24 — End: 2016-10-24
  Administered 2016-10-24: 4 mL via INTRAVENOUS

## 2016-10-24 MED ORDER — ATORVASTATIN 40 MG TABLET
40.0000 mg | ORAL_TABLET | Freq: Every day | ORAL | Status: DC
Start: 2016-10-24 — End: 2016-10-25
  Administered 2016-10-24: 40 mg via ORAL
  Filled 2016-10-24: qty 1

## 2016-10-24 MED ORDER — HYDROMORPHONE 1 MG/ML INJECTION SYRINGE
0.2000 mg | INJECTION | INTRAMUSCULAR | Status: DC | PRN
Start: 2016-10-24 — End: 2016-10-25
  Administered 2016-10-24 – 2016-10-25 (×2): 1 mg via INTRAVENOUS
  Filled 2016-10-24 (×2): qty 1

## 2016-10-24 MED ORDER — INTRAOP NACL 0.9% 1000 ML IV BAG
Status: DC | PRN
Start: 2016-10-24 — End: 2016-10-24
  Administered 2016-10-24 (×2): 1000 mL

## 2016-10-24 MED ORDER — SUGAMMADEX 100 MG/ML INTRAVENOUS SOLUTION
INTRAVENOUS | Status: DC | PRN
Start: 2016-10-24 — End: 2016-10-24
  Administered 2016-10-24: 300 mg via INTRAVENOUS

## 2016-10-24 MED ORDER — LIDOCAINE HCL 10 MG/ML (1 %) INJECTION SOLUTION
0.1000 mL | INTRAMUSCULAR | Status: DC | PRN
Start: 2016-10-24 — End: 2016-10-24
  Administered 2016-10-24: 0.1 mL

## 2016-10-24 MED ORDER — HYDROCODONE 5 MG-ACETAMINOPHEN 325 MG TABLET
1.0000 | ORAL_TABLET | ORAL | Status: DC | PRN
Start: 2016-10-24 — End: 2016-10-25
  Administered 2016-10-24 – 2016-10-25 (×2): 2 via ORAL
  Filled 2016-10-24 (×2): qty 2

## 2016-10-24 SURGICAL SUPPLY — 29 items
CATHETER TRAY FOLEY 16FR CLOSED (Catheter) ×2 IMPLANT
CAUTERY BOVIE PAD ADULT (Other) ×2 IMPLANT
COVER LIGHT HANDLE STERIS (Other) ×4 IMPLANT
DISC USE 105156 - STAPLER ENDOSCOPIC GIA UNIVERSAL SHORT (Staple) IMPLANT
DISC USE 152549 - PREP CHLORAPREP 26ML WITH TINT ~~LOC~~ (Prep) ×4 IMPLANT
DRAPE STERI IOBAN 3M 6650 (Drape) ×2 IMPLANT
DRESSING BANDAGE 1X3 FABRIC BAND-AID (Dressing) ×12 IMPLANT
ELECTRODE BLADE INSULATED 2.75IN (Blade) ×2 IMPLANT
GLOVES BIOGEL 7 TOP GLOVE LATEX (Glove) ×2 IMPLANT
GLOVES BIOGEL INDICATOR 7 1/2 GREEN UNDERGLOVE LATEX (Glove) ×2 IMPLANT
GOWN LARGE AERO BLUE AAMI 3 STANDARD (Gown) ×2 IMPLANT
HEADREST DONUT 9IN (M10-090) (Other) ×2 IMPLANT
LAPAROSCOPIC INSUFFLATION NEEDLE 120MM VERRES (Laparoscopic) ×2 IMPLANT
LAPAROSCOPIC SCISSORS ENDO SHEARS 5MM X 31CM (Cautery) IMPLANT
PACK LAPAROSCOPIC CHOLESTECTOMY (Pack) ×2 IMPLANT
PILLOW (322039) (Other) ×2 IMPLANT
PREP CLEANSING M CARE MEATAL (Other) ×2 IMPLANT
SCD SLEEVE CALF REG 18IN ALP 1 (Other) ×2 IMPLANT
SPECIMEN TRAP LUKEN MUCOUS 40ML (0035860) (Other) IMPLANT
SPONGE GAUZE 4 X 4IN 12PLY STERILE 10 PACK (Dressing) IMPLANT
STERILE LABELS (LBLBLANK) (Other) ×2 IMPLANT
SUTURE MONOCRYL 4-0 PS-2 27IN UNDYED (Suture) ×4 IMPLANT
SUTURE VICRYL 0 UR-6 27IN VIOLET (Suture) ×4 IMPLANT
SUTURE VICRYL 3-0 SH 27IN UNDYED (Suture) ×2 IMPLANT
TROCAR KII 12MM BALLOON (Laparoscopic) IMPLANT
TROCAR KII 12MM FIRST ENTRY ADVANCED FIXATION (Laparoscopic) IMPLANT
TROCAR KII 5MM FIRST ENTRY ADVANCED FIXATION (Laparoscopic) ×2 IMPLANT
TROCAR SLEEVE 5MM X 100MM KII ADVANCED FIXATION WITHOUT TROCAR (Laparoscopic) IMPLANT
TUBING SUCTION AND IRRIGATOR DISPOSABLE STRYKEFLOW (Other) IMPLANT

## 2016-10-24 NOTE — Telephone Encounter (Signed)
Called patient's wife to update her on the surgery and recommendation to keep patient overnight for observation. No answer, left message requesting return call.      Crist Infante, MD  Associate Professor  Surgical Oncology

## 2016-10-24 NOTE — OR Nursing (Signed)
OR To Postop Destination    Patient's level of consciousness: drowsy but responsive    Patient transferred to: PACU 2  Transported via: floor bed  Transported by: anesthesia person and circulator  Head of bed: elevated  Oxygen delivered via: face mask  Monitored enroute: no    Report given to: PACU RN

## 2016-10-24 NOTE — H&P (Addendum)
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 Patient found to have a new mass in the liver concerning for metastasis on surveillance imaging. 70 presents today for biopsy of the liver lesion. Prior umbilical hernia repair, does not think mesh was used.     Past Medical History:   Sarcoma s/p resection of LLE  Obesity s/p sleeve gastrectomy   Hypothyroidism  Hyperlipidemia     Past Surgical History:    Past Surgical History:   Procedure Laterality Date    ARTHROPLASTY, KNEE, TOTAL Right 2007    GASTRECTOMY, SLEEVE, LAPAROSCOPIC  2014    PR ADJ TISS XFER ANY AREA,30.1-60 SQCM  12/08/2014         PR ADJ TISS XFER ANY AREA,EA ADD 30.0 SQCM  12/08/2014         PR RAD RESECTION TUMOR SOFT TIS THIGH/KNEE 5 CM/>  12/08/2014         REPAIR, HERNIA, OPEN  2013       Current Medications:   Outpatient Prescriptions Marked as Taking for the 10/24/16 encounter [facility] Encounter)   Medication Sig Dispense Refill    ASPIRIN PO Take 81 mg by mouth every day.      aspirin-acetaminophen-caffeine 250-250-65 mg Tablet Take 65 mg by mouth if needed.      ATORVASTATIN CALCIUM (LIPITOR PO) Take 40 mg by mouth every day at bedtime. Take at 6pm every night.      LEVOTHYROXINE SODIUM (SYNTHROID PO) Take 137 mg by mouth every morning.      MULTIVITAMIN PO Take 1 capsule by mouth.      VITAMIN D-3 PO Take 500 mg by mouth 2 times daily, before morning and evening meals.          Allergies: Review of patient's allergies indicates no known allergies.    Social History: Former smoker. No drug or alcohol use.     Family History:   Family History   Problem Relation Age of Onset    throat cancer [OTHER] Father      68s, died of disease       Review of  Systems:  Constitutional: negative.  CV: negative.  Resp: negative.  GI: negative.  GU: negative.  Musculoskeletal: negative.  Integumentary: negative.      Physical Examination:  BP 133/86  Pulse 77  Temp 36.8 C (98.2 F)  Resp 20  Ht 1.778 m (5\' 10" )  Wt 133.6 kg (294 lb 8.6 oz)  SpO2 97%  BMI 42.26 kg/m2  GENERAL: The patient is alert, oriented and cooperative.  HEART: Regular rate and rhythm.   LUNGS: Clear to auscultation bilaterally.   ABDOMEN: morbidly obese, laparoscopic incisions well healed. No tenderness or distention   EXTREMITIES: No edema or rashes.       07/06/16 CT abdomen/pelvis     IMPRESSION:  LARGE AREA OF HYPODENSITY IN HEPATIC SEGMENT 4A/B WITHOUT DISCRETE MARGINS.  DIFFERENTIAL CONSIDERATIONS INCLUDE METASTATIC LESION, FOCAL FATTY  DEPOSITION OR PERFUSION ABNORMALITY. RECOMMEND LIVER PROTOCOL MRI FOR  FURTHER EVALUATION.    Assessment:  70 yo man with history of LLE leiomyosarcoma s/p radical resection, with new liver lesion concerning for spread of disease.  Plan:    Proceed with laparoscopic liver biopsy.     Risks of the procedure discussed including bleeding, infection and damage to surrounding structures. He understands and agrees to proceed.     Type and screen to be done now.       Patient evaluated and plan formulated with attending Dr. Steward Ros, MD   Department of General Surgery, PGY 5  Personal pager [phone]     This patient was seen, evaluated, and care plan was developed with the resident.  I agree with the assessment and plan as outlined in the resident's note.  Report electronically signed by Dannette Barbara, MD. Attending        Crist Infante, MD  Associate Professor  Surgical Oncology

## 2016-10-24 NOTE — Anesthesia Procedure Notes (Signed)
Airway  Date/Time: 10/24/2016 4:18 PM    Staff    Patient location:  [facility] OR  YI, [facility].  Performing Resident/CRNA: Jennings Books        Indications and Patient Condition    Spontaneous Ventilation: absent  Sedation level: level 0: deep/analgesia  Preoxygenated: yes  Patient position: ramp  MILS not maintained throughout  Mask difficulty assessment: 2 - vent by mask + OA or adjuvant +/- NMBA    Final Airway Details    Final airway type: endotracheal airway      Successful airway: cuffed ETT    Successful intubation technique: direct laryngoscopy  Facilitating devices/methods: intubating stylet  Endotracheal tube insertion site: oral  Blade: Macintosh  Blade size: #4  ETT size: 8.0 mm  Cormack-Lehane Classification: grade IIa - partial view of glottis  Placement verified by: chest auscultation and capnometry   Measured from: gums  Secured at 22 cm.  Number of attempts at approach: 1  Ventilation between attempts: none  Number of other approaches attempted: 0

## 2016-10-24 NOTE — Anesthesia Postprocedure Evaluation (Signed)
Patient: Louis Taylor    BIOPSY, LIVER, WITH US GUIDANCE  BIOPSY, LIVER, LAPAROSCOPIC    Anesthesia Type: general    Vital Signs (Last Recorded):  BP: 152/79  Pulse: 89  Resp: 25  Temp Max: 37.1 C (98.8 F)  (Last 24 hours)  Temp: 37.1 C (98.8 F)  SpO2: 99 % on    O2 Device: room air      Anesthesia Post Evaluation    Procedure: Procedure(s):BIOPSY, LIVER, WITH US GUIDANCEBIOPSY, LIVER, LAPAROSCOPIC  [facility]  Anesthesia: General    Patient location during evaluation: PACU  Patient participation: complete - patient participated  Level of consciousness: awake and alert  Pain management: adequate  Airway patency: patent  Anesthetic complications: no  Cardiovascular status: blood pressure returned to baseline  Respiratory status: spontaneous ventilation  Hydration status: euvolemic     Candelaria Celeste, MD        Dispo: Pt with good pain and nausea control. VSS. O2 sat >95% on RA. Pt resting comfortably in bed, responding appropriately. Meets discharge criteria. Stable for transfer to the floor.     Electronically signed by:   Candelaria Celeste, MD  Resident Physician PGY-3  Department of Anesthesiology & Pain Medicine  PI #: [id number] Pager #: [phone]  10/24/2016  20:51

## 2016-10-24 NOTE — Procedures (Signed)
Pre-Procedure Time Out Checklist  (do not remove)     Attestation:  ID verified by two sources (select any two from list): Name  Was this an emergency procedure?  no     I attest that I verified the following information prior to performing the procedure: Patient ID, Site and Procedure     ATTENDING BRIEF OPERATIVE NOTE  Service Surgical Oncology     Date of Service:  10/24/2016, 18:46    Pre-Op Diagnosis:  Liver mass, history of extremity leiomyosarcoma     Post-Op Diagnosis:  Same     Procedure Performed/Description:  Diagnostic laparoscopy, intraoperative ultrasound, core biopsy x 5 liver mass segment 4    Name of Surgeon and Assistants:  [name], Chtchetinin, Malik    Type of Anesthesia:  General anesthesia     ASA Class: 3     DVT Prophylaxis: SCDs    Findings:  Hypoechoic liver mass segment 4, frozen section compatible leiomyosarcoma (lesional tissue present)    Specimens Removed:  Core biopsy x 5    EBL:  75 mL     RBCs: none     Drains:  None     Fluids:  Per Anesthesia    OR Time: [id number] mins     Complications:  None     Outcome: stable     Potential Modifiers: Any of the Following Modifiers Apply To This Case?  -22 Unusual procedural service (must document what made it unusual)  no  -62 Co-Surgery (both surgeons must separately document their portions no  -80 Pensions consultant (faculty assist - non Medicare patient) no  -Quarry manager (faculty assist- Medicare patient, no qualified residents available) no  Are there any other factors about the case that are important for correct coding?  no    Operating Room Procedure Presence: I was physically present for the entire procedure.    The information contained on this form is true and accurate to the best of my knowledge.  Further, I understand that if I misrepresent, falsify or conceal information regarding my participation in the professional service described above, I may be subject to fine, imprisonment, or civil penalty under applicable federal  laws.     Dannette Barbara, MD Attending Physician

## 2016-10-24 NOTE — OR Nursing (Signed)
OR Arrive    Patient's level of consciousness: awake and comfortable    Summary report reviewed: yes    Patient transported to OR via: gurney  Transported by: anesthesia person  Head of bed: elevated  Oxygen delivered via: N/A  Monitored enroute: no  Assumed care.

## 2016-10-24 NOTE — Op Note (Signed)
DETAILED OPERATIVE NOTE    Pre-Op Diagnosis:1) history of extremity leiomyosarcoma.     2) liver mass suspicious for neoplasm    Post-Op Diagnosis:Same, rule out metastatic leiomyosarcoma     Procedure Performed/Description: 1) Diagnostic Laparoscopy        2) Needle biopsy liver        3) Intraoperative ultrasound    CPT [id number], [id number], [id number]    DATE: 10/24/16     PREOPERATIVE ASSESSMENT: [name] is a 44yr male with a complex oncologic history. For full details, please see the [facility] EMR. In brief, he was diagnosed with leiomyosarcoma of the left lower extremity in approximately November 2015. In December 2015, he underwent wide resection with curative intent. He tolerated this operation well, although there was some delayed wound healing. He received surgical monotherapy and remained under observation for soft tissue sarcoma surveillance. In July 2017, he was diagnosed with a possible liver mass. Follow up cross-sectional imaging with MRI confirmed a potentially suspicious liver mass. An attempted percutaneous biopsy with ultrasound was unsuccessful, so the patient was recommended to undergo laparoscopic liver biopsy for definitive diagnosis so that additional treatment recommendations could be formulated.    The patient was informed of the risks/benefits of the procedure. He was informed of the risks of bleeding, infection, and need for futher surgery. He was informed of the risk hematoma, seroma, or abscess. He was informed of the risk of damage to adjacent structures. The mass was centrally located in the liver, and there was a risk of damage to the bile ducts or portal vessels and hepatic arteries. This could lead to leak, abscess, fistula. The patient was informed of the rationale to obtain a definitive diagnosis. He was informed of the risk of another non-diagnostic result. He was informed of the risk of conversion to an open procedure. He was informed of the risk pulmonary complications,  including pneumonia, pneumothorax, and atelectasis. He was informed of the risk of damage to the bowel or bladder, especially with needle insufflation. He was informed of the risk of stroke, heart attack, even death. After a full discussion of the risks/benefits of the procedure, he consented to proceed.    DESCRIPTION OF THE PROCEDURE:    The patient was brought to the operating room. General endotracheal anesthesia was induced.The abdomen was then prepped and draped in standard surgical fashion. A preoperative time-out was performed    A small transverse incision was made in the left upper quadrant and followed by placement of the Veress needle in that location approximately 2 finger breadths below the costal margin without resistance. Normal saline drop test was performed without any problem, and low-flow pneumoperitoneum was created using carbon dioxide gas without hemodynamic instability. A 5-mm first entry port was placed at this site followed by insertion of the laparoscope. General exploration of the abdomen was performed. No intra-abdominal injuries were noted. Omental adhesions in the left upper quadrant to the parietal peritoneum were observed, and there was additional bowel adhesions to the abdominal wall in the midline.    Additional 5-mm ports were placed in the right upper quadrant to better visualize the liver, including one in a paramedian location and a 2nd 5 mm port more laterally along the right subcostal margin. The liver was identified and found to be rather fatty and enlarged. There was no evidence of surface lesions, but segment 4B along the falciform ligament was protuberant consistent with mass effect from the index tumor identified on cross-sectional imaging. We converted  the right paramedian port to a 12 mm port for introduction of the laparoscopic ultrasound. All of the ports after the first entry port were placed under direct visualization. The patient was then placed in a reverse  Trendelenburg position and slightly tilted to the left.    The laparoscopic ultrasound probe was then introduced. Sonogram of the liver was performed with focused attention to segments 4 and 5 adjacent to the gallbladder and falciform ligament. A hypoechoic, lobulated intra-parenchymal was identified consistent with the cross-sectional imaging. The mass was suspicious for neoplasm. There were surrounding vascular structures and luminal structures consistent with dominant biliary radicles and segmental vessels, but doppler examination of the mass revealed no evidence of intra-tumoral vessels of significant vascular flow. Photodocumentation was obtained.    We then introduced the 14G Bard core biopsy instrument adjacent and slightly cephalad to the ultrasound probe port. The needle was advanced transperitoneally into the liver under direct vision and ultrasound guidance. We visualized the needle entering the tumor. Care was taken to ensure that the needle was within the tumor and not advanced too deep before deploying the core biopsy instrument. Again, photodocumentation was obtained. We inspected the 1st core biopsy. Visually, it was consistent with lesional tissue. Since there was some back bleeding from the needle insertion, we elected to obtain frozen section examination to determine if lesional was present and maximize the yield of the procedure, especially if bleeding was problematic.     While we were waiting for frozen section, the back bleeding from the core biopsy instrument site was controlled with manual pressure using a Kitner instrument. This was satisfactory. We then obtained 4 additional core biopsies, again under ultrasound guidance, for a point slightly cephalad and to the left of the initial core biopsy site. These 4 additional cores were removed and inspected visually. These also appeared to be satisfactory. Frozen section examination demonstrated spindle cells compatible with leiomyosarcoma.  Therefore, we concluded that sufficient tissue had been obtained to establish a definitive diagnosis.     Repeat ultrasound was performed. There were increased internal echoes within the tumor consistent with post-biopsy changes, but there was no evidence of expanding hematoma or needle tract sign. Perihepatic blood and fluid was then irrigated and aspirated. A piece of Surgicel was placed over the core biopsy instrument entry site in the liver. This was hemostatic. Hemostasis was satisfactory on the operative field.     The ports were then removed under direct visualization, and no back-bleeding was encountered. The fascia of the 12 mm port site was closed with 0 Vicryl using an EndoClose device. Pneumoperitoneum was evacuated.The dermis was approximated with 3-0 Vicryl, and the skin closed with 4-0 Monocryl suture.Sterile dressings were applied.    The patient tolerated the procedure well. He was taken to the Recovery Room in stable condition. I was present for the entire procedure.      Crist Infante, MD  Associate Professor  Surgical Oncology

## 2016-10-24 NOTE — Telephone Encounter (Signed)
-----   Message from Dannette Barbara, MD sent at 10/24/2016  9:43 PM PST -----  Regarding: PET scan  Hi Michele,    Have you seen Mr. Enamorado PET/CT results? I don't recall seeing them. Can we also request a CD copy of the images? Thank you very much.    Mikki Santee

## 2016-10-24 NOTE — Telephone Encounter (Signed)
Called patient's friend Abbe Amsterdam to update him on the surgery and discuss with him my recommendation to keep the patient overnight for observation given the hour as well as the complexity of the procedure. Informed the patient's friend of the rationale for overnight observation as well as the anticipated discharge tomorrow AM assuming the patient's pain is well controlled and there remains no evidence of apparent complications. He verbalized understanding. He was thankful for call.      Crist Infante, MD  Associate Professor  Surgical Oncology

## 2016-10-24 NOTE — Telephone Encounter (Signed)
Spoke to the patient, Louis Taylor. Informed the patient that he does not have to report to radiology for his intra op [place]. He verbalized understanding.

## 2016-10-24 NOTE — Nurse Focus (Signed)
Pre-Op Admit Nursing Note  Note started on 10/24/2016 at 13:42    Received patient from as a direct admit at 1340 hours via ambulation.  Patient status awake and alert.  Oriented to room and unit.  Admission assessment and care plan initiated. PIN card provided to patient by [place].   Teola Bradley, RN

## 2016-10-24 NOTE — Progress Notes (Addendum)
Surgical Oncology Post-Operative Check    Attending: Dr. [name]  Procedure: diagnostic laparoscopy, intraoperative ultrasound, core biopsy x 5 liver mass segment 4     Subjective:  Patient is complaining of sharp RUQ pain under incision sites partially relieved with pain medication. He has been taking sips with no n/v. He is complaining of heartburn and requsts TUMS that he takes PRN. Has not had regular food and has not ambulated.     BP 152/79  Pulse 89  Temp 37.1 C (98.8 F) (Core)  Resp 25  Ht 1.778 m (5\' 10" )  Wt 133.6 kg (294 lb 8.6 oz)  SpO2 99%  BMI 42.26 kg/m2    General: awake, alert, no acute distress  CV: regular rate and rhythm  Resp: unlabored breathing  Abdomen: soft, appropriate tenderness in RUQ, RUQ incision sites (x3) and LUQ incision covered in bandages with no drainage      Assessment/Plan:  Louis Taylor is a 70yo man s/p diagnostic laparoscopy with core needle biopsy of liver mass doing well post-operatively.     - pain control with Norco, dilaudid IV BT   - regular diet  - TUMS added q6h PRN   - plan for discharge in AM pending adequate pain control on PO pain medication and stable Hb on AM CBC     Darrol Angel, PGY-1  Surgical Oncology  Service Pager: [phone]     This patient was seen by the resident.  I reviewed and agree with the resident's assessment and plan as outlined in the resident's note.  Report electronically signed by Dannette Barbara, MD. Attending      Check chest xray.      Crist Infante, MD  Associate Professor  Surgical Oncology

## 2016-10-25 ENCOUNTER — Other Ambulatory Visit: Payer: Self-pay

## 2016-10-25 LAB — CBC NO DIFFERENTIAL
HEMATOCRIT: 33 % — AB (ref 41.0–53.0)
HEMOGLOBIN: 11.3 g/dL — AB (ref 13.5–17.5)
MCH: 32.3 pg (ref 27.0–33.0)
MCHC: 34.2 % (ref 32.0–36.0)
MCV: 94.6 UM3 (ref 80.0–100.0)
MPV: 7 UM3 (ref 6.8–10.0)
PLATELET COUNT: 170 10*3/uL (ref 130–400)
RDW: 13.6 U (ref 0.0–14.7)
RED CELL COUNT: 3.49 10*6/uL — AB (ref 4.50–5.90)
WHITE BLOOD CELL COUNT: 5.7 10*3/uL (ref 4.5–11.0)

## 2016-10-25 LAB — BASIC METABOLIC PANEL
CALCIUM: 8.8 mg/dL (ref 8.6–10.5)
CARBON DIOXIDE TOTAL: 26 mmol/L (ref 24–32)
CHLORIDE: 104 mmol/L (ref 95–110)
CREATININE BLOOD: 0.97 mg/dL (ref 0.44–1.27)
GLUCOSE: 207 mg/dL — AB (ref 70–99)
POTASSIUM: 4.1 mmol/L (ref 3.3–5.0)
SODIUM: 137 mmol/L (ref 135–145)
UREA NITROGEN, BLOOD (BUN): 13 mg/dL (ref 8–22)

## 2016-10-25 LAB — HEPATITIS B SURFACE ANTIGEN: HEPATITIS B SURFACE ANTIGEN: NONREACTIVE

## 2016-10-25 LAB — HEPATITIS C AB SCREEN: HEPATITIS C AB SCREEN: NONREACTIVE

## 2016-10-25 MED ORDER — HYDROCODONE 10 MG-ACETAMINOPHEN 325 MG TABLET
1.0000 | ORAL_TABLET | Freq: Four times a day (QID) | ORAL | 0 refills | Status: AC | PRN
Start: 2016-10-25 — End: 2016-11-24
  Filled 2016-10-25: qty 30, 4d supply, fill #0

## 2016-10-25 MED ORDER — DOCUSATE SODIUM 100 MG CAPSULE
100.0000 mg | ORAL_CAPSULE | Freq: Two times a day (BID) | ORAL | 11 refills | Status: AC
Start: 2016-10-25 — End: 2017-10-20
  Filled 2016-10-25: qty 60, 30d supply, fill #0

## 2016-10-25 MED ORDER — REMOVE LIDOCAINE PATCH
1.0000 | Status: DC
Start: 2016-10-25 — End: 2016-10-25

## 2016-10-25 MED ORDER — IBUPROFEN 600 MG TABLET
600.0000 mg | ORAL_TABLET | Freq: Four times a day (QID) | ORAL | 3 refills | Status: AC | PRN
Start: 2016-10-25 — End: 2017-10-20
  Filled 2016-10-25: qty 30, 8d supply, fill #0

## 2016-10-25 MED ORDER — LEVOTHYROXINE 137 MCG TABLET
137.0000 ug | ORAL_TABLET | Freq: Every day | ORAL | 11 refills | Status: AC
Start: 2016-10-25 — End: 2017-10-20
  Filled 2016-10-25: qty 30, 30d supply, fill #0

## 2016-10-25 MED ORDER — HYDROCODONE 10 MG-ACETAMINOPHEN 325 MG TABLET
2.0000 | ORAL_TABLET | ORAL | Status: DC | PRN
Start: 2016-10-25 — End: 2016-10-25
  Administered 2016-10-25: 2 via ORAL
  Filled 2016-10-25: qty 2

## 2016-10-25 MED ORDER — HYDROCODONE 10 MG-ACETAMINOPHEN 325 MG TABLET
1.0000 | ORAL_TABLET | ORAL | Status: DC | PRN
Start: 2016-10-25 — End: 2016-10-25

## 2016-10-25 MED ORDER — LIDOCAINE 5 % TOPICAL PATCH
1.0000 | MEDICATED_PATCH | TOPICAL | Status: DC
Start: 2016-10-25 — End: 2016-10-25
  Filled 2016-10-25: qty 1

## 2016-10-25 NOTE — Discharge Instructions (Signed)
SURGICAL ONCOLOGY DISCHARGE INSTRUCTIONS  MRN: [id number]; NAME: Louis Taylor; AGE: 44yr; SEX: male  Date: 10/25/2016     Diagnosis: Liver mass suspicious for neoplasm  Procedure:            1) Diagnostic Laparoscopy                                  2) Needle biopsy liver                                  3) Intraoperative ultrasound    MEDICATIONS  - you may start using Motrin (ibuprofen) tomorrow 10/26/16 for pain  - use Norco as needed if pain is not relieved by motrin or tylenol  - Do not drive or operate machinery while taking narcotic pain medications.    Current Discharge Medication List      START taking these medications    Details   Docusate (COLACE) 100 mg Capsule Take 1 capsule by mouth 2 times daily. While on narcotic pain medication  Qty: 60 capsule, Refills: 11      Hydrocodone 10 mg/Acetaminophen 325 mg (NORCO) per tablet Take 1-2 tablets by mouth every 6 hours if needed for pain.  Qty: 30 tablet, Refills: 0      Ibuprofen (MOTRIN) 600 mg Tablet Take 1 tablet by mouth every 6 hours if needed. take with food  Qty: 30 tablet, Refills: 3         CONTINUE these medications which have CHANGED    Details   LevoTHYROxine (SYNTHROID) 137 mcg tablet Take 1,000 tablets by mouth every morning.  Qty: 30000 tablet, Refills: 11         CONTINUE these medications which have NOT CHANGED    Details   ASPIRIN PO Take 81 mg by mouth every day.      aspirin-acetaminophen-caffeine 250-250-65 mg Tablet Take 65 mg by mouth if needed.      ATORVASTATIN CALCIUM (LIPITOR PO) Take 40 mg by mouth every day at bedtime. Take at 6pm every night.      CALCIUM CARBONATE/VITAMIN D3 (CALCIUM 500 + D, D3, PO) Take 5,000 Units by mouth every day.      MULTIVITAMIN PO Take 1 capsule by mouth.      OMEPRAZOLE (PRILOSEC PO) Take 20 mg by mouth every day at bedtime.      VITAMIN D-3 PO Take 500 mg by mouth 2 times daily, before morning and evening meals.              Tylenol: Many post-op pain medications contain Tylenol as well as a narcotic,  so check the dosage of the Tylenol (Acetaminophen) in the pain medication you have been prescribed, so as not to exceed the 4000 milligrams every 24 hours. This means you can take 2 tablets of pain medication every 4 hours if needed.    Constipation: Narcotics can cause constipation. You received doses of narcotics during your hospital stay, and have been discharged with narcotic pain medication. Over the counter stool softeners (ducosate sodium, colace) can help with any constipation you experience post operatively. These are sold at grocery stores and drug stores without a prescription. You may take 1-2 gel tabs twice a day if needed. Customize the dose to your needs. If you experience more severe constipation - (ie. have not had a bowel movement in 2 days  since you've left the hospital) consider taking a laxative. Milk of Magnesia, Miralax, Senna (Senokot-S) or prune juice all work well, but you can use what you like. If you feel full in your rectum, consider using a fleets enema or a suppository (dulcolax). Stop taking bowel care medications if you begin to have loose watery stools or consider decreasing the frequency or dose of stool softener.     ACTIVITY   - No heavy lifting of more than 10 pounds for the next 2 weeks.  - You may shower with warm water and soap 2 days after the surgery.  - No baths or submersion in water (swimming pool, jacuzzi, etc) for the next 4-6 weeks.    DIET  -  Resume your normal diet   - Soups and foods that are easy to digest are best tolerated as you begin to eat (avoid spicy and fatty food).  - Drink plenty of fluids.    WOUND  - Keep wounds clean and dry  - You may remove your entire outer dressing tomorrow, shower and dress the wounds with dry gauze.   - No baths or swimming for 2 weeks    FOLLOW-UP  - Follow-up in our Outpatient Surgery Clinic in 10-14 days. An appointment will be scheduled for you, and you will be contacted with the appointment time. If you have not received a  phone call within 2 business days or you need to reschedule please call the clinic: [phone]    -Call the clinic or go to the Emergency Department nearest you for any symptoms such as fevers with temperature greater than 101.5 F, chills, persistent nausea and vomiting, severe pain not controlled with medications, pus drainage from the wound site, or for any acute problems or illnesses. You may also call the hospital 24-hrs a day at [phone] and ask for the Surgical Oncology resident on-call if you have any particular questions or concerns.    You may receive a survey in the mail about your hospital stay. We would greatly appreciate your feedback by completing and returning the survey. We review these results monthly and use them to recognize our staff for all of the work they are doing. It also helps [place] to continuously improve our programs and patient care.  Thank-you for choosing the [facility] [place] for your care.

## 2016-10-25 NOTE — Progress Notes (Addendum)
SURGICAL ONCOLOGY PHYSICIAN DAILY PROGRESS NOTE  Surgical Oncology Ward Follow-Up Note    Name: Louis Taylor  Date of Admission: 10/24/2016 12:02 PM    MRN: [id number]    Patient's PCP: [facility] INFORMATION    Patient ID: Louis Taylor is a 81yr male admitted on 10/24/2016 who is POD #1 s/p diagnostic laparoscopy, core biopsy x 5 liver mass segment 4     Procedures:   10/24/16 - diagnostic laparoscopy, intraoperative ultrasound, core biopsy x 5 liver mass segment 4     INTERVAL 24 HOUR HISTORY  - Pain is controlled with Norco  - Tolerating a Regular Diet  - Ambulating with assistance  - Foley removed  - positive flatus, no BM      VITALS:  Vitals (Current):  Temp: 36.7 C (98.1 F)  Pulse: 87  BP: 162/90  Resp: 18  SpO2: 95 %  Vitals (24 Hours Min/Max):   Temp Max: 37.1 C (98.8 F)   Pulse Min: 77 Max: 94   BP: (126-162)/(79-94)     Resp Min: 13 Max: 25   SpO2 Min: 88 % Max: 100 %        INS AND OUTS:  I/O Last 2 Completed Shifts:  In: 2070 [Oral:570; Crystalloid:1500]  Out: [id number] [Urine:1250; Other:75]     DRAINS: none    PHYSICAL EXAM:  General Appearance: well-appearing, NAD, laying in bed  Heart: regular rate and rhythm   Lungs: non-labored respirations   Abdomen: soft, appropriate tenderness in RUQ, RUQ incision sites (x3) and LUQ incision covered in bandages with no drainage    Extremities: moving all extremities, SCDs in place          INTERVAL LABS  POC Glucose, blood: --  CBC Recent labs for the past 72 hours     10/25/16 0656 10/24/16 2205 10/24/16 1439    WHITE BLOOD CELL COUNT 5.7 7.6 --    HEMOGLOBIN 11.3* 11.2* 12.6*    HEMATOCRIT 33.0* 33.0* 37.1*    PLATELET COUNT 170 182 --       BMP (48 hours): Recent labs for the past 48 hours     10/25/16 0750    SODIUM 137    POTASSIUM 4.1    CHLORIDE 104    CARBON DIOXIDE TOTAL 26    CREATININE BLOOD 0.97    UREA NITROGEN, BLOOD (BUN) 13    GLUCOSE 207*    CALCIUM 8.8    MAGNESIUM (MG) --       No results found for this  basename: INR:*,PTT:* in the last 24 hours    ARTERIAL BLOOD GASES   No results found for this basename: ARTPH:*,ARTPCO2:*,ARTPO2:*,ARTHCO3:*,ARTBE:*,ARTO2SAT:* in the last 24 hours       ASSESSMENT AND PLAN  Patient is a 50yr male POD #1 s/p diagnostic laparoscopy, and core biopsy x 5 liver mass, following an expected postoperative recovery course.    Neuro/Pain: Increased Norco from 5mg  to 10mg  due to inadequate pain control, will start Motrin tomorrow  CV: RRR, no acute concern  Pulm: IS  FEN/GI: Regular Diet, saline locked  GU: foley removed, voiding spontaneously  MSK: Ambulate, OOBTC  Ppx: SCDs    Dispo: Home today if pain is adequately controlled on Norco 10.    Electronically signed by:    Lourena Simmonds, MD PGY1  PI: [phone]  Surg Onc service pager: [phone]  Personal pager: [phone]    This patient was seen by the resident.  I reviewed and agree with the resident's assessment and plan as outlined in the resident's note.  Report electronically signed by Dannette Barbara, MD. Attending        Crist Infante, MD  Associate Professor  Surgical Oncology

## 2016-10-25 NOTE — Nurse Assessment (Signed)
ASSESSMENT NOTE    Note Started: 10/25/2016, 07:22     Initial assessment completed and recorded in EMR.  Report received from night shift nurse and orders reviewed. Plan of Care reviewed and updated, discussed with patient.  Leitha Schuller, RN RN

## 2016-10-25 NOTE — Nurse Discharge Note (Signed)
Discharge to home via wheelchair to private auto at Date: 10/25/16 at Time: 1348.   Belongings with patient.  See EMR for assessment.  Leitha Schuller, RN

## 2016-10-28 LAB — RED BLOOD CELLS ADULT
UNIT BLOOD TYPE: 9500
UNIT BLOOD TYPE: 9500
UNIT BLOOD TYPE: O NEG
UNIT BLOOD TYPE: O NEG
UNIT EXPIRATION: 201711152359
UNIT EXPIRATION: 201711222359

## 2016-10-30 LAB — SURGICAL PATHOLOGY

## 2016-11-05 ENCOUNTER — Telehealth: Payer: Self-pay

## 2016-11-05 ENCOUNTER — Other Ambulatory Visit: Payer: Self-pay

## 2016-11-05 DIAGNOSIS — Z9889 Other specified postprocedural states: Secondary | ICD-10-CM

## 2016-11-05 DIAGNOSIS — C787 Secondary malignant neoplasm of liver and intrahepatic bile duct: Secondary | ICD-10-CM | POA: Insufficient documentation

## 2016-11-05 DIAGNOSIS — C499 Malignant neoplasm of connective and soft tissue, unspecified: Principal | ICD-10-CM

## 2016-11-05 NOTE — Telephone Encounter (Signed)
Called Dr. Megan Salon to discuss possible referral of patient for chemotherapy. Left message requesting return call.      Crist Infante, MD  Associate Professor  Surgical Oncology

## 2016-11-05 NOTE — Progress Notes (Signed)
See phone encounter for further details. Thank you very much.      Diar Ronald Londo, MD  Associate Professor  Surgical Oncology

## 2016-11-05 NOTE — Telephone Encounter (Signed)
Called patient to discuss biopsy results. 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 Discussed with him possible combined modality therapy treatment options. Indicated that for oligometastatic disease with a long disease free interval, my standard recommendation is systemic chemotherapy, followed by restaging to assess for response, and then possible surgery. Discussed with him in the abstract the risks/benefits of various treatment options including chemotherapy and surgery in a combined modality therapy approach, palliative chemotherapy, and palliative care only. Discussed with him risks of morbidity and mortality with treatment, risks of morbidity and mortality with no treatment, and possible overall survival outcomes with various treatment approaches.     Discussed with him referral to Dr. Megan Salon in [facility] for Medical Oncology evaluation and management recommendations. Will place referral. Patient has follow up appointment with me in approximately 1 week for postoperative visit. Will plan on additional combined modality therapy treatment planning at that time. He verbalized understanding. He was thankful for call.    His postoperative pain from the biopsy has resolved by this point. Overall, he is doing well at baseline.    Mr. Long Intriago is comfortable with these recommendations and eager to proceed. He knows to contact me with questions or concerns.      Crist Infante, MD  Associate Professor  Surgical Oncology

## 2016-11-06 NOTE — Progress Notes (Signed)
Received CD from [facility]  on 11/06/2016. CD contains CT Whole Body . DOS 10/19/2016. Uploaded and archived CD. CD given to RN Sharyn Lull

## 2016-11-07 ENCOUNTER — Other Ambulatory Visit: Payer: Self-pay

## 2016-11-12 ENCOUNTER — Ambulatory Visit: Payer: Medicare Other

## 2016-11-12 VITALS — BP 138/85 | HR 85 | Temp 97.9°F | Resp 14 | Ht 68.0 in | Wt 294.5 lb

## 2016-11-12 DIAGNOSIS — C4922 Malignant neoplasm of connective and soft tissue of left lower limb, including hip: Secondary | ICD-10-CM | POA: Insufficient documentation

## 2016-11-12 DIAGNOSIS — C787 Secondary malignant neoplasm of liver and intrahepatic bile duct: Principal | ICD-10-CM | POA: Insufficient documentation

## 2016-11-12 DIAGNOSIS — C499 Malignant neoplasm of connective and soft tissue, unspecified: Secondary | ICD-10-CM

## 2016-11-12 NOTE — Nursing Note (Signed)
Pt. Name and D.O.B verified,Vitals taken,   Pharmacy verified,  Allergies verified,   Pain scale screened,   Pt. Is asked for my chart enrollment,     Kapono Luhn MA 1            Reviewed with patient if any new or worsening emotional or social concerns.     No- No action needed.

## 2016-11-12 NOTE — Progress Notes (Signed)
SURGICAL ONCOLOGY CLINIC NOTE    Dear Dr. 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  Although I know you are familiar with the patient's medical history, please allow me to summarize it for my own records.    The patient is a delightful 45yrmale who presented to me in approximately November 2015 with a palpable mass in the lateral aspect of the distal thigh/ proximal lower leg. Biopsy was consistent with leiomyosarcoma. There was no evidence of metastases at presentation. Wide excision was performed, and advancement flaps were indicated because of skin paddle excision given subcutaneous nature of the tumor. Given overall low risk features of primary tumor, adjuvant radiotherapy was not performed. The patient has been under surveillance since that time.    Approximately 4 months, he was seen for routine follow up surveillance. Cross-sectional imaging demonstrated a potentially suspicious liver mass. MRI confirmed a potentially suspicious liver mass. There was no other suspicious lesion identified in the liver, lungs, or soft tissues. He was referred for ultrasound-guided liver biopsy which was not performed. Therefore, approximately 2 weeks ago, we performed a laparoscopic liver biopsy. The patient returns today for interval evaluation.    Clinically, he experienced moderate right upper quadrant/ costal margin pain which has been resolving post-procedure. He is otherwise stable at his baseline. He denies fevers/chills/sweats. He denies cough or shortness of breath. He denies nausea or vomiting. He denies change in bowel or bladder habits. His pain score is 0/10. He is taking Tylenol only for pain management at this time.    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    History of pulmonary embolism    History of renal failure    Knee joint replacement status    Soft tissue tumor    History of gastric restrictive surgery    H/O umbilical hernia repair    Family history of cancer    Thyroid dysfunction    Mixed hyperlipidemia    Type 2 diabetes mellitus with complication    History of knee surgery    Arthritis, degenerative    Aftercare following surgery for cancer or tumor    Liver mass    Abnormal CT of liver    History of sarcoma    Liver metastasis       Past surgical history:   Past Surgical History:   Procedure Laterality Date    ARTHROPLASTY, KNEE, TOTAL Right 2007    GASTRECTOMY, SLEEVE, LAPAROSCOPIC  2014    PR ADJ TISS XFER ANY AREA,30.1-60 SQCM  12/08/2014         PR ADJ TISS XFER ANY AREA,EA ADD 30.0 SQCM  12/08/2014         PR LAP,DIAGNOSTIC ABDOMEN  10/24/2016         PR NEEDLE BIOPSY LIVER,W OTHR PROC  10/24/2016         PR RAD RESECTION TUMOR SOFT TIS THIGH/KNEE 5 CM/>  12/08/2014         PR [place], NEEDLE PLACEMENT, RADIOLOGICAL S&I  10/24/2016         REPAIR, HERNIA, OPEN  2013       Medications:   Current Outpatient Prescriptions on File  Prior to Visit   Medication Sig Dispense Refill    ASPIRIN PO Take 81 mg by mouth every day.      aspirin-acetaminophen-caffeine 250-250-65 mg Tablet Take 65 mg by mouth if needed.      ATORVASTATIN CALCIUM (LIPITOR PO) Take 40 mg by mouth every day at bedtime. Take at 6pm every night.      CALCIUM CARBONATE/VITAMIN D3 (CALCIUM 500 + D, D3, PO) Take 5,000 Units by mouth every day.      Docusate (COLACE) 100 mg Capsule Take 1 capsule by mouth 2 times daily. While on narcotic pain medication 60 capsule 11    Hydrocodone 10 mg/Acetaminophen 325 mg (NORCO) per tablet Take 1-2 tablets by mouth every 6 hours if needed for pain. 30 tablet 0    Ibuprofen (MOTRIN) 600 mg Tablet Take 1 tablet by mouth every 6 hours if needed. take with food 30 tablet 3     LevoTHYROxine (SYNTHROID) 137 mcg tablet Take 1 tablet by mouth every morning. 30 tablet 11    MULTIVITAMIN PO Take 1 capsule by mouth.      OMEPRAZOLE (PRILOSEC PO) Take 20 mg by mouth every day at bedtime.      VITAMIN D-3 PO Take 500 mg by mouth 2 times daily, before morning and evening meals.       No current facility-administered medications on file prior to visit.        Allergies: No Known Allergies    Social history: reviewed and unchanged  Social History     Social History    Marital status: MARRIED     Spouse name: N/A    Number of children: N/A    Years of education: N/A     Social History Main Topics    Smoking status: Former Smoker     Packs/day: 1.00     Years: 10.00     Types: Cigarettes    Smokeless tobacco: Never Used      Comment: approx 10 pack years, quit 30 years ago    Alcohol use 3.6 - 4.2 oz/week     0 - 1 Standard drinks or equivalent, 6 Shots of liquor per week      Comment: Whiskey    Drug use: No    Sexual activity: Not on file     Other Topics Concern    Not on file     Social History Narrative       Family history: reviewed and unchanged  Family History   Problem Relation Age of Onset    throat cancer [OTHER] Father      22s, died of disease     On examination, he is no acute distress.  Temp: 36.6 C (97.9 F) (12/04 0912)  Temp src: Oral (12/04 0912)  Pulse: 85 (12/04 0912)  BP: 138/85 (12/04 0912)  Resp: 14 (12/04 0912)  SpO2: 96 % (12/04 0912)  Height: 172.7 cm (5' 8") (12/04 8676)  Weight: 133.6 kg (294 lb 8.6 oz) (12/04 0912)  Body mass index is 44.78 kg/(m^2).  Head/Face negative icterus, mucous membranes moist.  Neck negative adenopathy, trachea midline.  Chest breasts moderate gynecomastia, no masses. Axilla no adenopathy bilaterally.  Abdomen obese, extensive post-surgical changes from prior surgical operations, well-healed laparoscopic biopsy sites, moderate tenderness to palpation along costal margin, but no masses, no hernias, and no bony deformity.  Groins  negative masses, negative adenopathy. Positive pannus.  Extremities warm without edema. He moves all 4 extremities with good range of  motion.  Back negative masses, negative rashes.  Neuro non-focal.  Psych alert and oriented x 3.    DATA REVIEW: pathology results from biopsy procedure on 10/24/16 were reviewed with findings as noted below.    DIAGNOSIS                               A and B.  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 Immunohistochemical      stains were performed with satisfactory controls on block A1 and B1 and the tumor      cells are:            Cytokeratin AE1/AE3: Negative      Smooth muscle actin: Positive      Desmin: Negative      S100: Negative      CD34:  Negative      Ki-67: up to 60%            The tumor cells show morphologic similarity to pervious leg mass ([id number]),      compatible with metastatic leiomyosarcoma. Correlation with clinical and radiologic      finding is recommend to exclude other possible primary sites.     IMPRESSION AND RECOMMENDATIONS: I had a long discussion with the patient regarding my impression and recommendations. I reviewed the clinical, radiographic, and pathology findings. I indicated he is alive with disease. The patient's KPS is 100%.    I discussed with him the natural history and disease biology of stage IV soft tissue sarcoma/ leiomyosarcoma. I indicated that the cornerstone of therapy is systemic therapy. I indicated that a subset of patients have so-called oligo-metastatic disease which may be amenable to aggressive combined modality therapy, including systemic therapy and local therapy such as surgical resection/ metastasectomy. For patients with few metastases (such as Louis Taylor case) and a favorable disease free interval (typically > 1 year), surgical resection in the  setting of an aggressive combined modality therapy approach is associated with superior long term outcome/ overall survival.    I discussed with Louis Taylor possible reevaluation after upfront chemotherapy/ systemic therapy for possible surgical resection. Unfortunately, his morbid obesity and the central location of the tumor make him a high risk surgical candidate. Given the central location of the tumor, surgical resection would likely require an extended left hepatectomy/ trisegmentectomy. However, I informed the patient that we would certainly be willing to have a frank discussion with him regarding the risks/benefits of major hepatectomy should he demonstrate a favorable response to chemotherapy. He verbalized understanding.    I have taken the liberty of referring the patient to Dr. Megan Taylor at [facility]. Dr. Megan Taylor has kindly agreed to see him for a new patient consultation. I have asked the patient to return to clinic following standard chemotherapy for repeat staging studies and possible evaluation regarding further surgical management.    30 minutes were spent with the patient, of which more than 50% was spent counseling and/or coordinating care on the diagnosis of stage IV leiomyosarcoma, the natural history and disease biology of stage IV soft tissue sarcoma, the recommendation for  systemic therapy, and the possible role for surgery after chemotherapy with the caveat regarding the high risk nature of surgery. The patient understands and agrees with the plan of care as outlined.     Louis Taylor is comfortable with these recommendations and eager to proceed. He knows to contact me with questions or concerns.      Crist Infante, MD  Associate Professor  Surgical Oncology

## 2016-11-14 DIAGNOSIS — C787 Secondary malignant neoplasm of liver and intrahepatic bile duct: Secondary | ICD-10-CM | POA: Insufficient documentation

## 2016-11-14 NOTE — Communication Body (Signed)
SURGICAL ONCOLOGY CLINIC NOTE    Dear Dr. Andrew G Burt,    I had the pleasure of seeing 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  Although I know you are familiar with the patient's medical history, please allow me to summarize it for my own records.    The patient is a delightful 70yr male who presented to me in approximately November 2015 with a palpable mass in the lateral aspect of the distal thigh/ proximal lower leg. Biopsy was consistent with leiomyosarcoma. There was no evidence of metastases at presentation. Wide excision was performed, and advancement flaps were indicated because of skin paddle excision given subcutaneous nature of the tumor. Given overall low risk features of primary tumor, adjuvant radiotherapy was not performed. The patient has been under surveillance since that time.    Approximately 4 months, he was seen for routine follow up surveillance. Cross-sectional imaging demonstrated a potentially suspicious liver mass. MRI confirmed a potentially suspicious liver mass. There was no other suspicious lesion identified in the liver, lungs, or soft tissues. He was referred for ultrasound-guided liver biopsy which was not performed. Therefore, approximately 2 weeks ago, we performed a laparoscopic liver biopsy. The patient returns today for interval evaluation.    Clinically, he experienced moderate right upper quadrant/ costal margin pain which has been resolving post-procedure. He is otherwise stable at his baseline. He denies fevers/chills/sweats. He denies cough or shortness of breath. He denies nausea or vomiting. He denies change in bowel or bladder habits. His pain score is 0/10. He is taking Tylenol only for pain management at this time.    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    History of pulmonary embolism    History of renal failure    Knee joint replacement status    Soft tissue tumor    History of gastric restrictive surgery    H/O umbilical hernia repair    Family history of cancer    Thyroid dysfunction    Mixed hyperlipidemia    Type 2 diabetes mellitus with complication    History of knee surgery    Arthritis, degenerative    Aftercare following surgery for cancer or tumor    Liver mass    Abnormal CT of liver    History of sarcoma    Liver metastasis       Past surgical history:   Past Surgical History:   Procedure Laterality Date    ARTHROPLASTY, KNEE, TOTAL Right 2007    GASTRECTOMY, SLEEVE, LAPAROSCOPIC  2014    PR ADJ TISS XFER ANY AREA,30.1-60 SQCM  12/08/2014         PR ADJ TISS XFER ANY AREA,EA ADD 30.0 SQCM  12/08/2014         PR LAP,DIAGNOSTIC ABDOMEN  10/24/2016         PR NEEDLE BIOPSY LIVER,W OTHR PROC  10/24/2016         PR RAD RESECTION TUMOR SOFT TIS THIGH/KNEE 5 CM/>  12/08/2014         PR US GUIDANCE, NEEDLE PLACEMENT, RADIOLOGICAL S&I  10/24/2016         REPAIR, HERNIA, OPEN  2013       Medications:   Current Outpatient Prescriptions on File   Prior to Visit   Medication Sig Dispense Refill    ASPIRIN PO Take 81 mg by mouth every day.      aspirin-acetaminophen-caffeine 250-250-65 mg Tablet Take 65 mg by mouth if needed.      ATORVASTATIN CALCIUM (LIPITOR PO) Take 40 mg by mouth every day at bedtime. Take at 6pm every night.      CALCIUM CARBONATE/VITAMIN D3 (CALCIUM 500 + D, D3, PO) Take 5,000 Units by mouth every day.      Docusate (COLACE) 100 mg Capsule Take 1 capsule by mouth 2 times daily. While on narcotic pain medication 60 capsule 11    Hydrocodone 10 mg/Acetaminophen 325 mg (NORCO) per tablet Take 1-2 tablets by mouth every 6 hours if needed for pain. 30 tablet 0    Ibuprofen (MOTRIN) 600 mg Tablet Take 1 tablet by mouth every 6 hours if needed. take with food 30 tablet 3     LevoTHYROxine (SYNTHROID) 137 mcg tablet Take 1 tablet by mouth every morning. 30 tablet 11    MULTIVITAMIN PO Take 1 capsule by mouth.      OMEPRAZOLE (PRILOSEC PO) Take 20 mg by mouth every day at bedtime.      VITAMIN D-3 PO Take 500 mg by mouth 2 times daily, before morning and evening meals.       No current facility-administered medications on file prior to visit.        Allergies: No Known Allergies    Social history: reviewed and unchanged  Social History     Social History    Marital status: MARRIED     Spouse name: N/A    Number of children: N/A    Years of education: N/A     Social History Main Topics    Smoking status: Former Smoker     Packs/day: 1.00     Years: 10.00     Types: Cigarettes    Smokeless tobacco: Never Used      Comment: approx 10 pack years, quit 30 years ago    Alcohol use 3.6 - 4.2 oz/week     0 - 1 Standard drinks or equivalent, 6 Shots of liquor per week      Comment: Whiskey    Drug use: No    Sexual activity: Not on file     Other Topics Concern    Not on file     Social History Narrative       Family history: reviewed and unchanged  Family History   Problem Relation Age of Onset    throat cancer [OTHER] Father      40s, died of disease     On examination, he is no acute distress.  Temp: 36.6 C (97.9 F) (12/04 0912)  Temp src: Oral (12/04 0912)  Pulse: 85 (12/04 0912)  BP: 138/85 (12/04 0912)  Resp: 14 (12/04 0912)  SpO2: 96 % (12/04 0912)  Height: 172.7 cm (5' 8") (12/04 0912)  Weight: 133.6 kg (294 lb 8.6 oz) (12/04 0912)  Body mass index is 44.78 kg/(m^2).  Head/Face negative icterus, mucous membranes moist.  Neck negative adenopathy, trachea midline.  Chest breasts moderate gynecomastia, no masses. Axilla no adenopathy bilaterally.  Abdomen obese, extensive post-surgical changes from prior surgical operations, well-healed laparoscopic biopsy sites, moderate tenderness to palpation along costal margin, but no masses, no hernias, and no bony deformity.  Groins  negative masses, negative adenopathy. Positive pannus.  Extremities warm without edema. He moves all 4 extremities with good range of   motion.  Back negative masses, negative rashes.  Neuro non-focal.  Psych alert and oriented x 3.    DATA REVIEW: pathology results from biopsy procedure on 10/24/16 were reviewed with findings as noted below.    DIAGNOSIS                               A and B.  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 Immunohistochemical      stains were performed with satisfactory controls on block A1 and B1 and the tumor      cells are:            Cytokeratin AE1/AE3: Negative      Smooth muscle actin: Positive      Desmin: Negative      S100: Negative      CD34:  Negative      Ki-67: up to 60%            The tumor cells show morphologic similarity to pervious leg mass ([id number]),      compatible with metastatic leiomyosarcoma. Correlation with clinical and radiologic      finding is recommend to exclude other possible primary sites.     IMPRESSION AND RECOMMENDATIONS: I had a long discussion with the patient regarding my impression and recommendations. I reviewed the clinical, radiographic, and pathology findings. I indicated he is alive with disease. The patient's KPS is 100%.    I discussed with him the natural history and disease biology of stage IV soft tissue sarcoma/ leiomyosarcoma. I indicated that the cornerstone of therapy is systemic therapy. I indicated that a subset of patients have so-called oligo-metastatic disease which may be amenable to aggressive combined modality therapy, including systemic therapy and local therapy such as surgical resection/ metastasectomy. For patients with few metastases (such as Louis Taylor case) and a favorable disease free interval (typically > 1 year), surgical resection in the  setting of an aggressive combined modality therapy approach is associated with superior long term outcome/ overall survival.    I discussed with Louis Taylor possible reevaluation after upfront chemotherapy/ systemic therapy for possible surgical resection. Unfortunately, his morbid obesity and the central location of the tumor make him a high risk surgical candidate. Given the central location of the tumor, surgical resection would likely require an extended left hepatectomy/ trisegmentectomy. However, I informed the patient that we would certainly be willing to have a frank discussion with him regarding the risks/benefits of major hepatectomy should he demonstrate a favorable response to chemotherapy. He verbalized understanding.    I have taken the liberty of referring the patient to Dr. Campbell at [facility]. Dr. Campbell has kindly agreed to see him for a new patient consultation. I have asked the patient to return to clinic following standard chemotherapy for repeat staging studies and possible evaluation regarding further surgical management.    30 minutes were spent with the patient, of which more than 50% was spent counseling and/or coordinating care on the diagnosis of stage IV leiomyosarcoma, the natural history and disease biology of stage IV soft tissue sarcoma, the recommendation for   systemic therapy, and the possible role for surgery after chemotherapy with the caveat regarding the high risk nature of surgery. The patient understands and agrees with the plan of care as outlined.     Louis Taylor is comfortable with these recommendations and eager to proceed. He knows to contact me with questions or concerns.      Nickolus Jack Mineau, MD  Associate Professor  Surgical Oncology

## 2017-03-27 ENCOUNTER — Ambulatory Visit: Admit: 2017-03-27 | Discharge: 2017-03-27 | Disposition: A | Discharge status: Home / Self Care | Payer: Self-pay

## 2017-03-27 ENCOUNTER — Ambulatory Visit (HOSPITAL_BASED_OUTPATIENT_CLINIC_OR_DEPARTMENT_OTHER): Admit: 2017-03-27 | Discharge: 2017-03-27 | Disposition: A | Discharge status: Home / Self Care | Payer: Medicare Other

## 2017-04-01 ENCOUNTER — Telehealth: Payer: Self-pay

## 2017-04-01 NOTE — Telephone Encounter (Signed)
Received message from Dr. Delorise Shiner that mutual patient has completed 6 cycles of Dox/ Olara with question regarding possible repeat referral for consideration of liver resection.    Will plan on seeing the patient for follow up consultation regarding possible operation and risks/benefits of surgery.    MD not available, left message requesting return call.      Crist Infante, MD  Associate Professor  Surgical Oncology

## 2017-04-02 ENCOUNTER — Telehealth: Payer: Self-pay

## 2017-04-02 NOTE — Telephone Encounter (Signed)
Requested recent CT scan from Gibson, RN

## 2017-04-02 NOTE — Telephone Encounter (Signed)
-----   Message from Dannette Barbara, MD sent at 04/01/2017  6:16 PM PDT -----  Regarding: outside CT from [place],    Patient's oncologist called me today and said patient's outside cross-sectional imaging is in our system. I do not see it. Can you please look into it so I can decide whether patient should come back for a follow up visit?    Patient is reluctant to schedule a follow up appointment until I have reviewed the cross-sectional imaging. Thank you very much.    Mikki Santee

## 2017-04-05 ENCOUNTER — Other Ambulatory Visit: Payer: Self-pay

## 2017-04-05 ENCOUNTER — Telehealth: Payer: Self-pay

## 2017-04-05 NOTE — Telephone Encounter (Signed)
Review/concur. Thank you very much.      Louis Aron Needles, MD  Associate Professor  Surgical Oncology

## 2017-04-05 NOTE — Progress Notes (Signed)
Received CD from [place] on 04/05/17. CD contains CT ABD. DOS 03/27/17. Uploaded and archived CD. Returned to [employer].      Annice Pih, MA II

## 2017-04-05 NOTE — Telephone Encounter (Signed)
Called patient. Verified patient first name, last name, and date of birth at initiation of contact.    Discussed with patient Dr. [name] would like to see him for follow up on 5/7 at 3:30 pm. Patient confirmed appointment details.    Patient had his CT scan completed 4/18 at [facility]. He will bring copy of imaging cd to appointment for review.    Patient was appreciative of call.    Mahalia Longest, RN

## 2017-04-05 NOTE — Telephone Encounter (Signed)
-----   Message from Dannette Barbara, MD sent at 04/05/2017 11:01 AM PDT -----  Regarding: RE: f/u appt  Hi Selinda Eon,    Good pickup. Please schedule for 5/7 at 3:30 pm, ok to overbook. Thank you very much.    Please ask him to hand carry his recent CT as a backup in case we do not receive by then.     Mikki Santee    ----- Message -----     From: Mahalia Longest, RN     Sent: 04/05/2017  10:12 AM       To: Mahalia Longest, RN, Dannette Barbara, MD  Subject: f/u appt                                         Hi Dr. [name],    When would you like to bring in patient to discuss surgery? His recent CT has been requested but nothing received yet. I was going to follow up today.    Selinda Eon

## 2017-04-08 NOTE — Telephone Encounter (Signed)
Please update on status of scheduling appt.    Mahalia Longest, RN

## 2017-04-09 ENCOUNTER — Other Ambulatory Visit: Payer: Self-pay

## 2017-04-09 DIAGNOSIS — Z9221 Personal history of antineoplastic chemotherapy: Secondary | ICD-10-CM | POA: Insufficient documentation

## 2017-04-09 DIAGNOSIS — C787 Secondary malignant neoplasm of liver and intrahepatic bile duct: Secondary | ICD-10-CM

## 2017-04-09 DIAGNOSIS — C499 Malignant neoplasm of connective and soft tissue, unspecified: Principal | ICD-10-CM

## 2017-04-09 NOTE — Progress Notes (Signed)
Outside imaging for [place] review. Thank you very much.      Crist Infante, MD  Associate Professor  Surgical Oncology

## 2017-04-10 DIAGNOSIS — C787 Secondary malignant neoplasm of liver and intrahepatic bile duct: Secondary | ICD-10-CM

## 2017-04-11 ENCOUNTER — Other Ambulatory Visit: Payer: Self-pay | Admitting: Transplant Surgery

## 2017-04-11 DIAGNOSIS — C499 Malignant neoplasm of connective and soft tissue, unspecified: Principal | ICD-10-CM

## 2017-04-11 NOTE — Telephone Encounter (Signed)
Pt scheduled for 5/7 at 3:20 per message below.  FYI    Regards,  [place] II

## 2017-04-15 ENCOUNTER — Ambulatory Visit: Payer: Medicare Other

## 2017-04-15 VITALS — BP 126/80 | HR 87 | Temp 98.5°F | Resp 16 | Ht 68.0 in | Wt 288.4 lb

## 2017-04-15 DIAGNOSIS — Z9221 Personal history of antineoplastic chemotherapy: Secondary | ICD-10-CM | POA: Insufficient documentation

## 2017-04-15 DIAGNOSIS — C787 Secondary malignant neoplasm of liver and intrahepatic bile duct: Secondary | ICD-10-CM | POA: Insufficient documentation

## 2017-04-15 DIAGNOSIS — C499 Malignant neoplasm of connective and soft tissue, unspecified: Principal | ICD-10-CM | POA: Insufficient documentation

## 2017-04-15 NOTE — Progress Notes (Signed)
SURGICAL ONCOLOGY CLINIC NOTE    Dear Dr. Ronnell Guadalajara and Dr. Delorise Shiner,    I had the pleasure of seeing [name] and his wife for repeat evaluation regarding his history of left lower extremity leiomyosarcoma with solitary liver metastasis. Although I know you are familiar with the patient's medical history, please allow me to summarize it for my own records.    The patient is a 12yr male who was diagnosed with a relatively low risk subcutaneous left lower extremity leiomyosarcoma approximately 3 years ago. He was treated with surgical monotherapy, and a complete resection with curative intent was obtained. No adjuvant radiotherapy was recommended, and he then began a program of soft tissue sarcoma surveillance. In approximately July 2017, a liver mass was identified on surveillance cross-sectional imaging. This was of unclear etiology. Because of insurance issues, it took several months before a liver biopsy.     Eventually, in November 2017, a laparoscopic liver biopsy was performed. Surgical pathology was consistent with metastatic leiomyosarcoma. Staging studies demonstrated no evidence of additional sites of metastases. The patient was recommended to undergo systemic chemotherapy given the new diagnosis of stage IV leiomyosarcoma. Mr. Reich was referred to Dr. Megan Salon of [facility]. The patient received combination Doxorubicin/ Olaratumab. Overall, he tolerated chemotherapy/ systemic treatment well. He now returns for repeat Surgical Oncology evaluation and consideration of possible surgical resection of his liver metastasis.    Clinically, he is overall doing well. He reports positive fatigue during chemotherapy which is resolving. He reports burning in his hands and feet. He denies fevers/chills/sweats. He denies nausea or vomiting. He denies cough or hemoptysis. He denies weight loss. He denies new soft tissue tumors on his body. His pain score is 0/10. He takes no  medications for pain management.    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    History of pulmonary embolism    History of renal failure    Knee joint replacement status    Soft tissue tumor    History of gastric restrictive surgery    H/O umbilical hernia repair    Family history of cancer    Thyroid dysfunction    Mixed hyperlipidemia    Type 2 diabetes mellitus with complication    History of knee surgery    Arthritis, degenerative    Aftercare following surgery for cancer or tumor    Liver mass    Abnormal CT of liver    History of sarcoma    Liver metastasis    Liver metastases    History of cancer chemotherapy       Past surgical history:   Past Surgical History:   Procedure Laterality Date    ARTHROPLASTY, KNEE, TOTAL Right 2007    GASTRECTOMY, SLEEVE, LAPAROSCOPIC  2014    PR ADJ TISS XFER ANY AREA,30.1-60 SQCM  12/08/2014         PR ADJ TISS XFER ANY AREA,EA ADD 30.0 SQCM  12/08/2014         PR LAP,DIAGNOSTIC ABDOMEN  10/24/2016         PR NEEDLE BIOPSY LIVER,W OTHR PROC  10/24/2016         PR RAD RESECTION TUMOR SOFT TIS THIGH/KNEE 5 CM/>  12/08/2014         PR US GUIDANCE, NEEDLE  PLACEMENT, RADIOLOGICAL S&I  10/24/2016         REPAIR, HERNIA, OPEN  2013       Medications:   Current Outpatient Prescriptions on File Prior to Visit   Medication Sig Dispense Refill    ASPIRIN PO Take 81 mg by mouth every day.      aspirin-acetaminophen-caffeine 250-250-65 mg Tablet Take 65 mg by mouth if needed.      ATORVASTATIN CALCIUM (LIPITOR PO) Take 40 mg by mouth every day at bedtime. Take at 6pm every night.      CALCIUM CARBONATE/VITAMIN D3 (CALCIUM 500 + D, D3, PO) Take 5,000 Units by mouth every day.      Docusate (COLACE) 100 mg Capsule Take 1 capsule by mouth 2 times daily. While on narcotic pain medication 60 capsule 11    Ibuprofen (MOTRIN) 600 mg  Tablet Take 1 tablet by mouth every 6 hours if needed. take with food 30 tablet 3    LevoTHYROxine (SYNTHROID) 137 mcg tablet Take 1 tablet by mouth every morning. 30 tablet 11    MULTIVITAMIN PO Take 1 capsule by mouth.      OMEPRAZOLE (PRILOSEC PO) Take 20 mg by mouth every day at bedtime.      VITAMIN D-3 PO Take 500 mg by mouth 2 times daily, before morning and evening meals.       No current facility-administered medications on file prior to visit.        Allergies: No Known Allergies    Social history: reviewed and unchanged  Social History     Social History    Marital status: MARRIED     Spouse name: N/A    Number of children: N/A    Years of education: N/A     Social History Main Topics    Smoking status: Former Smoker     Packs/day: 1.00     Years: 10.00     Types: Cigarettes    Smokeless tobacco: Never Used      Comment: approx 10 pack years, quit 30 years ago    Alcohol use 3.6 - 4.2 oz/week     0 - 1 Standard drinks or equivalent, 6 Shots of liquor per week      Comment: Whiskey    Drug use: No    Sexual activity: Not on file     Other Topics Concern    Not on file     Social History Narrative       Family history: reviewed and unchanged  Family History   Problem Relation Age of Onset    throat cancer [OTHER] Father      75s, died of disease     On examination, he is no acute distress.   Temp: 36.9 C (98.5 F) (05/07 1447)  Temp src: Temporal (05/07 1447)  Pulse: 87 (05/07 1447)  BP: 126/80 (05/07 1447)  Resp: 16 (05/07 1447)  SpO2: 95 % (05/07 1447)  Height: 172.7 cm (5\' 8" ) (05/07 1447)  Weight: 130.8 kg (288 lb 5.8 oz) (05/07 1447)  Body mass index is 43.85 kg/(m^2).  Head/Face negative icterus, mucous membranes moist.  Neck negative adenopathy, trachea midline.  Chest breasts moderate gynecomastia, no masses. Axilla no adenopathy bilaterally.  Abdomen obese, soft, no masses, no tenderness to palpation. Well-healed laparoscopic scars and bariatric surgery scars. Umbilicus absent s/p  prior surgery.   Groins positive pannus, negative masses, negative adenopathy bilaterally.  Extremities warm without edema. He moves all 4 extremities with good range  of motion.  Back negative masses, negative rashes.  Neuro non-focal.  Psych alert and oriented x 3.    His left lower extremity scars s/p leiomyosarcoma excision are well-healed with no evidence of recurrent tumor or bony deformity.     DATA REVIEW: CT abdomen and pelvis was performed on 03/27/17. I personally reviewed images with findings as noted below.    Liver mass is again demonstrated in segment 4A/B of the liver. It currently measures approximately 8 x 7 x 6 cm. There is no other evidence of intra-abdominal metastases, but recent cross-sectional imaging of the thorax has not been performed.     IMPRESSION AND RECOMMENDATIONS: I had a long discussion with the patient and his wife regarding my impression and recommendations. I reviewed the clinical and radiographic findings. I indicated he is alive with disease. The patient's KPS is 100%.    I discussed with him the natural history and disease biology of stage IV soft tissue sarcoma. I indicated that extremity leiomyosarcoma < 5 cm in maximal dimension typically carries an approximately 20% risk or less of distant recurrence within 5 years of diagnosis. The predominant site of distant recurrence with extremity leiomyosarcoma is the pulmonary parenchyma, but liver metastases can occur as well. Overall, systemic therapy is the cornerstone of treatment for stage IV soft tissue sarcoma, but among patients who have had a favorable response to chemotherapy and/or who have limited burden of disease, so-called oligo metastases, there is a potential role for local therapy in the management of their disease.     Overall, surgical resection of oligo-metastatic disease is associated with superior oncologic outcome. This is contingent of complete resection of all gross disease, and it is possible that this  association is a correlative relationship rather than a causative relationship. However, there are clearly long term survivors of metastatic sarcoma patients who have undergone complete resection of metastases, so-called metastastectomy. Although rare, this more commonly occurs with lung metastases, but has also been observed with limited liver metastases as well.    I discussed with the patient options for further treatment at this time. I indicated that Chest CT is recommended to ensure no evidence of new sites of metastases in the interim since initiating chemotherapy. Although unlikely, this is important to rule out before considering maximally invasive partial hepatectomy. I discussed with the patient other options for treatment, including additional systemic therapy, liver-directed therapy with ablative techniques such as embolization and/or ablation, or surgical resection. I discussed with him the risks/benefits of each of these approaches. Overall, I indicated that surgical resection appears to offer the greatest oncologic benefit, although ablative and resectional approaches have not been compared to surgery in a randomized trial. Overall, for patients who have resectable lesions and who are surgical candidates, surgical resection is favored, although the degree of oncologic benefit of this approach is not universally agreed upon. In my judgement, I tend to endorse surgical resection for patients with resectable lesions and who are surgical candidates, especially if they have other favorable prognostic factors, such as limited number of metastases and longer disease free interval, since this suggests more favorable disease biology and greater long term benefit, even possible cure, with surgical resection. Long term cure is considered unlikely with additional systemic therapy only as well as ablative techniques such as embolization or ablation. In addition, given the size of this tumor and its proximity to  the left portal vein, it would appear that ablation is not technically feasible in Mr. Schadt case.  I discussed with the patient in detail these issues. I have recommended a referral to Dr. Estill Cotta of our [facility] surgery section. I think this area of expertise regarding liver resection is more in line with her skill set. I will see him back for additional treatment planning either after surgical resection or after Dr. Estill Cotta has had an opportunity to evaluate the patient for possible resection. In addition, as discussed with Dr. Estill Cotta, we are recommending an MRI of the liver with Eovist for additional anatomic characterization of the mass in relation to the hepatic vasculature and segmental anatomy. The patient verbalized understanding, and all questions were answered. The patient verbalized understanding that liver resection is maximally invasive. It is also associated with the greatest acute morbidity and mortality. However, it is also associated with superior long term oncologic outcome.     50 minutes were spent with the patient, of which more than 50% was spent counseling and/or coordinating care on the natural history and disease biology of stage IV leiomyosarcoma, the implication of oligometastatic disease, the treatment options, and the risks/benefits of liver resection. The patient understands and agrees with the plan of care as outlined.       Crist Infante, MD  Associate Professor  Surgical Oncology

## 2017-04-18 ENCOUNTER — Telehealth: Payer: Self-pay

## 2017-04-18 NOTE — Telephone Encounter (Signed)
Patient identified by name, DOB, and phone number.    Callers Name/Relationship): the patient    Phone Number/Best Time to Call: [phone] (home)  / anytime    Reason For Call: patient states that he would like to let MD Gholami know that his insurance denied his MRI and is requesting that the MD do an appeal.    Action Requested:  Please Advise Caller.      Thank you

## 2017-04-18 NOTE — Telephone Encounter (Signed)
Please note MRI abd was placed by Dr. Estill Cotta on 04/11/17 with authorization obtained by Geralynn Ochs from insurance on 04/16/17. See Josem Kaufmann for reference.  Dr. [name] had placed MRI abd order as well on 04/15/17 which was requested for insurance authorization. Please note this order and request is a duplicate, therefore the 2nd request was denied. Discussed with Nelly Rout which she confirmed of this information, auth for MRI is still valid as documented.    Spoke with pt after ID verified x3.   Informed pt we had received insurance authorization for his MRI which is currently scheduled for 04/23/17. I apologized for the confusion as the MRI scan was ordered twice with two separate request for insurance authorization, therefore the second request was denied as the first request had already been approved. Pt verbalized understanding, expressed appreciation of the explanation and call to clarify. Pt with no further questions or concerns.    Paralee Cancel, RN

## 2017-04-21 ENCOUNTER — Telehealth: Payer: Self-pay

## 2017-04-21 NOTE — Telephone Encounter (Signed)
-----   Message from Dannette Barbara, MD sent at 04/21/2017  4:07 PM PDT -----  Regarding: needs Chest CT  Hi,    In looking through my notes, I don't a recent CT chest for this patient. I just ordered a new one. Can you ask for it to be scheduled please? Thank you very much.    Mikki Santee

## 2017-04-21 NOTE — Communication Body (Signed)
SURGICAL ONCOLOGY CLINIC NOTE    Dear Dr. Ronnell Guadalajara and Dr. 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 Although I know you are familiar with the patient's medical history, please allow me to summarize it for my own records.    The patient is a 34yr male who was diagnosed with a relatively low risk subcutaneous left lower extremity leiomyosarcoma approximately 3 years ago. He was treated with surgical monotherapy, and a complete resection with curative intent was obtained. No adjuvant radiotherapy was recommended, and he then began a program of soft tissue sarcoma surveillance. In approximately July 2017, a liver mass was identified on surveillance cross-sectional imaging. This was of unclear etiology. Because of insurance issues, it took several months before a liver biopsy.     Eventually, in November 2017, a laparoscopic liver biopsy was performed. Surgical pathology was consistent with metastatic leiomyosarcoma. Staging studies demonstrated no evidence of additional sites of metastases. The patient was recommended to undergo systemic chemotherapy given the new diagnosis of stage IV leiomyosarcoma. Louis Taylor was referred to Dr. Megan Salon of [facility]. The patient received combination Doxorubicin/ Olaratumab. Overall, he tolerated chemotherapy/ systemic treatment well. He now returns for repeat Surgical Oncology evaluation and consideration of possible surgical resection of his liver metastasis.    Clinically, he is overall doing well. He reports positive fatigue during chemotherapy which is resolving. He reports burning in his hands and feet. He denies fevers/chills/sweats. He denies nausea or vomiting. He denies cough or hemoptysis. He denies weight loss. He denies new soft tissue tumors on his body. His pain score is 0/10. He takes no  medications for pain management.    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    History of pulmonary embolism    History of renal failure    Knee joint replacement status    Soft tissue tumor    History of gastric restrictive surgery    H/O umbilical hernia repair    Family history of cancer    Thyroid dysfunction    Mixed hyperlipidemia    Type 2 diabetes mellitus with complication    History of knee surgery    Arthritis, degenerative    Aftercare following surgery for cancer or tumor    Liver mass    Abnormal CT of liver    History of sarcoma    Liver metastasis    Liver metastases    History of cancer chemotherapy       Past surgical history:   Past Surgical History:   Procedure Laterality Date    ARTHROPLASTY, KNEE, TOTAL Right 2007    GASTRECTOMY, SLEEVE, LAPAROSCOPIC  2014    PR ADJ TISS XFER ANY AREA,30.1-60 SQCM  12/08/2014         PR ADJ TISS XFER ANY AREA,EA ADD 30.0 SQCM  12/08/2014         PR LAP,DIAGNOSTIC ABDOMEN  10/24/2016         PR NEEDLE BIOPSY LIVER,W OTHR PROC  10/24/2016         PR RAD RESECTION TUMOR SOFT TIS THIGH/KNEE 5 CM/>  12/08/2014         PR US GUIDANCE, NEEDLE  PLACEMENT, RADIOLOGICAL S&I  10/24/2016         REPAIR, HERNIA, OPEN  2013       Medications:   Current Outpatient Prescriptions on File Prior to Visit   Medication Sig Dispense Refill    ASPIRIN PO Take 81 mg by mouth every day.      aspirin-acetaminophen-caffeine 250-250-65 mg Tablet Take 65 mg by mouth if needed.      ATORVASTATIN CALCIUM (LIPITOR PO) Take 40 mg by mouth every day at bedtime. Take at 6pm every night.      CALCIUM CARBONATE/VITAMIN D3 (CALCIUM 500 + D, D3, PO) Take 5,000 Units by mouth every day.      Docusate (COLACE) 100 mg Capsule Take 1 capsule by mouth 2 times daily. While on narcotic pain medication 60 capsule 11    Ibuprofen (MOTRIN) 600 mg  Tablet Take 1 tablet by mouth every 6 hours if needed. take with food 30 tablet 3    LevoTHYROxine (SYNTHROID) 137 mcg tablet Take 1 tablet by mouth every morning. 30 tablet 11    MULTIVITAMIN PO Take 1 capsule by mouth.      OMEPRAZOLE (PRILOSEC PO) Take 20 mg by mouth every day at bedtime.      VITAMIN D-3 PO Take 500 mg by mouth 2 times daily, before morning and evening meals.       No current facility-administered medications on file prior to visit.        Allergies: No Known Allergies    Social history: reviewed and unchanged  Social History     Social History    Marital status: MARRIED     Spouse name: N/A    Number of children: N/A    Years of education: N/A     Social History Main Topics    Smoking status: Former Smoker     Packs/day: 1.00     Years: 10.00     Types: Cigarettes    Smokeless tobacco: Never Used      Comment: approx 10 pack years, quit 30 years ago    Alcohol use 3.6 - 4.2 oz/week     0 - 1 Standard drinks or equivalent, 6 Shots of liquor per week      Comment: Whiskey    Drug use: No    Sexual activity: Not on file     Other Topics Concern    Not on file     Social History Narrative       Family history: reviewed and unchanged  Family History   Problem Relation Age of Onset    throat cancer [OTHER] Father      39s, died of disease     On examination, he is no acute distress.   Temp: 36.9 C (98.5 F) (05/07 1447)  Temp src: Temporal (05/07 1447)  Pulse: 87 (05/07 1447)  BP: 126/80 (05/07 1447)  Resp: 16 (05/07 1447)  SpO2: 95 % (05/07 1447)  Height: 172.7 cm (5\' 8" ) (05/07 1447)  Weight: 130.8 kg (288 lb 5.8 oz) (05/07 1447)  Body mass index is 43.85 kg/(m^2).  Head/Face negative icterus, mucous membranes moist.  Neck negative adenopathy, trachea midline.  Chest breasts moderate gynecomastia, no masses. Axilla no adenopathy bilaterally.  Abdomen obese, soft, no masses, no tenderness to palpation. Well-healed laparoscopic scars and bariatric surgery scars. Umbilicus absent s/p  prior surgery.   Groins positive pannus, negative masses, negative adenopathy bilaterally.  Extremities warm without edema. He moves all 4 extremities with good range  of motion.  Back negative masses, negative rashes.  Neuro non-focal.  Psych alert and oriented x 3.    His left lower extremity scars s/p leiomyosarcoma excision are well-healed with no evidence of recurrent tumor or bony deformity.     DATA REVIEW: CT abdomen and pelvis was performed on 03/27/17. I personally reviewed images with findings as noted below.    Liver mass is again demonstrated in segment 4A/B of the liver. It currently measures approximately 8 x 7 x 6 cm. There is no other evidence of intra-abdominal metastases, but recent cross-sectional imaging of the thorax has not been performed.     IMPRESSION AND RECOMMENDATIONS: I had a long discussion with the patient and his wife regarding my impression and recommendations. I reviewed the clinical and radiographic findings. I indicated he is alive with disease. The patient's KPS is 100%.    I discussed with him the natural history and disease biology of stage IV soft tissue sarcoma. I indicated that extremity leiomyosarcoma < 5 cm in maximal dimension typically carries an approximately 20% risk or less of distant recurrence within 5 years of diagnosis. The predominant site of distant recurrence with extremity leiomyosarcoma is the pulmonary parenchyma, but liver metastases can occur as well. Overall, systemic therapy is the cornerstone of treatment for stage IV soft tissue sarcoma, but among patients who have had a favorable response to chemotherapy and/or who have limited burden of disease, so-called oligo metastases, there is a potential role for local therapy in the management of their disease.     Overall, surgical resection of oligo-metastatic disease is associated with superior oncologic outcome. This is contingent of complete resection of all gross disease, and it is possible that this  association is a correlative relationship rather than a causative relationship. However, there are clearly long term survivors of metastatic sarcoma patients who have undergone complete resection of metastases, so-called metastastectomy. Although rare, this more commonly occurs with lung metastases, but has also been observed with limited liver metastases as well.    I discussed with the patient options for further treatment at this time. I indicated that Chest CT is recommended to ensure no evidence of new sites of metastases in the interim since initiating chemotherapy. Although unlikely, this is important to rule out before considering maximally invasive partial hepatectomy. I discussed with the patient other options for treatment, including additional systemic therapy, liver-directed therapy with ablative techniques such as embolization and/or ablation, or surgical resection. I discussed with him the risks/benefits of each of these approaches. Overall, I indicated that surgical resection appears to offer the greatest oncologic benefit, although ablative and resectional approaches have not been compared to surgery in a randomized trial. Overall, for patients who have resectable lesions and who are surgical candidates, surgical resection is favored, although the degree of oncologic benefit of this approach is not universally agreed upon. In my judgement, I tend to endorse surgical resection for patients with resectable lesions and who are surgical candidates, especially if they have other favorable prognostic factors, such as limited number of metastases and longer disease free interval, since this suggests more favorable disease biology and greater long term benefit, even possible cure, with surgical resection. Long term cure is considered unlikely with additional systemic therapy only as well as ablative techniques such as embolization or ablation. In addition, given the size of this tumor and its proximity to  the left portal vein, it would appear that ablation is not technically feasible in Mr. Talerico case.  I discussed with the patient in detail these issues. I have recommended a referral to Dr. Estill Cotta of our [facility] surgery section. I think this area of expertise regarding liver resection is more in line with her skill set. I will see him back for additional treatment planning either after surgical resection or after Dr. Estill Cotta has had an opportunity to evaluate the patient for possible resection. In addition, as discussed with Dr. Estill Cotta, we are recommending an MRI of the liver with Eovist for additional anatomic characterization of the mass in relation to the hepatic vasculature and segmental anatomy. The patient verbalized understanding, and all questions were answered. The patient verbalized understanding that liver resection is maximally invasive. It is also associated with the greatest acute morbidity and mortality. However, it is also associated with superior long term oncologic outcome.     50 minutes were spent with the patient, of which more than 50% was spent counseling and/or coordinating care on the natural history and disease biology of stage IV leiomyosarcoma, the implication of oligometastatic disease, the treatment options, and the risks/benefits of liver resection. The patient understands and agrees with the plan of care as outlined.       Crist Infante, MD  Associate Professor  Surgical Oncology

## 2017-04-23 ENCOUNTER — Ambulatory Visit
Admission: RE | Admit: 2017-04-23 | Discharge: 2017-04-23 | Disposition: A | Discharge status: Home / Self Care | Payer: Medicare Other | Source: Ambulatory Visit | Attending: Diagnostic Radiology | Admitting: Diagnostic Radiology

## 2017-04-23 DIAGNOSIS — C499 Malignant neoplasm of connective and soft tissue, unspecified: Principal | ICD-10-CM | POA: Insufficient documentation

## 2017-04-23 MED ORDER — GADOXETATE 0.25 MMOL/ML (181.43 MG/ML) INTRAVENOUS SOLUTION
10.0000 mL | INTRAVENOUS | Status: AC
Start: 2017-04-23 — End: 2017-04-23
  Administered 2017-04-23: 10 mL via INTRAVENOUS

## 2017-04-24 ENCOUNTER — Telehealth: Payer: Self-pay

## 2017-04-24 NOTE — Telephone Encounter (Signed)
Returned call to patient. Verified patient first name, last name, and date of birth at initiation of contact.    We discussed CT chest ordered for updated imaging and to rule out metastasis. Clarified his scan completed in April was of the abdomen and pelvis; we still need the chest to be complete. He verbalized understanding.     Orders already sent to [facility], Fax# [phone], PH# [phone]. Patient has number and will call to schedule, he will attempt to complete prior to appt 5/21 with Dr. Estill Cotta. He will hand carry imaging cd to appt.    Mahalia Longest, RN

## 2017-04-24 NOTE — Telephone Encounter (Signed)
Patient identified by name, DOB, and phone number.    Callers Name/Relationship): Pt    Phone Number/Best Time to Call: [phone] / anytime    Reason For Call: Pt received a call to schedule a CT scan but states he has no idea what it is for. Caller is requesting a call back to discuss.    Action Requested: Please Advise Caller.    Thank you

## 2017-04-24 NOTE — Telephone Encounter (Signed)
He needs an updated CT chest to update his staging evaluation and to rule out metastases. Thank you very much.      Crist Infante, MD  Associate Professor  Surgical Oncology

## 2017-04-26 NOTE — Telephone Encounter (Signed)
[facility] regarding CT Chest scheduling, spoke with Louis Taylor. She stated they did not receive the CT chest orders and have not received a call from the patient to schedule.    Refaxed the orders, referral, demographics, and CMP order for their radiology dept.    Mahalia Longest, RN

## 2017-04-29 ENCOUNTER — Ambulatory Visit: Payer: Medicare Other | Attending: Transplant Surgery | Admitting: Transplant Surgery

## 2017-04-29 VITALS — BP 122/81 | HR 93 | Temp 97.6°F | Resp 16 | Ht 67.87 in | Wt 290.6 lb

## 2017-04-29 DIAGNOSIS — Z79899 Other long term (current) drug therapy: Secondary | ICD-10-CM

## 2017-04-29 DIAGNOSIS — C499 Malignant neoplasm of connective and soft tissue, unspecified: Secondary | ICD-10-CM | POA: Insufficient documentation

## 2017-04-29 DIAGNOSIS — C4922 Malignant neoplasm of connective and soft tissue of left lower limb, including hip: Principal | ICD-10-CM | POA: Insufficient documentation

## 2017-04-29 DIAGNOSIS — C787 Secondary malignant neoplasm of liver and intrahepatic bile duct: Secondary | ICD-10-CM | POA: Insufficient documentation

## 2017-04-29 NOTE — Progress Notes (Deleted)
SURGICAL ONCOLOGY CONSULT  Date of Consult: 04/29/2017    Attending: Dr. Estill Cotta    Requesting Physician: Dr. [name]      REASON FOR CONSULTATION: Leiomyosarcoma metastatic to the liver    HISTORY OF PRESENT ILLNESS     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  He underwent complete resection of the left lower extremity tumor with curative attempt by Dr. [name] three years ago and no adjuvant treatment was recommended at the time. Unfortunately, surveillance imaging in July of 2017 revealed a new mass in the liver.  This lesion was biopsied by Dr. [name] on 10/24/16 revealing metastatic leiomyosarcoma. Staging studies demonstrated no evidence of additional sites of metastases. He has since been followed by Dr. Megan Salon at [facility] and treated with combination Doxorubicin/ Olaratumab. He has tolerated this therapy well and was referred for evaluation of possible resection of the liver metastasis.    Past Medical History:   Patient Active Problem List   Diagnosis    Leiomyosarcoma    S/P biopsy    Mass of thigh    Malignant neoplasm of connective and soft tissue    History of pulmonary embolism    History of renal failure    Knee joint replacement status    Soft tissue tumor    History of gastric restrictive surgery    H/O umbilical hernia repair    Family history of cancer    Thyroid dysfunction    Mixed hyperlipidemia    Type 2 diabetes mellitus with complication    History of knee surgery    Arthritis, degenerative    Aftercare following surgery for cancer or tumor    Liver mass    Abnormal CT of liver    History of sarcoma    Liver metastasis    Liver metastases    History of cancer chemotherapy     Past Surgical History:  Past Surgical History:   Procedure Laterality Date    ARTHROPLASTY, KNEE, TOTAL Right 2007    GASTRECTOMY, SLEEVE, LAPAROSCOPIC  2014    PR ADJ TISS XFER ANY  AREA,30.1-60 SQCM  12/08/2014         PR ADJ TISS XFER ANY AREA,EA ADD 30.0 SQCM  12/08/2014         PR LAP,DIAGNOSTIC ABDOMEN  10/24/2016         PR NEEDLE BIOPSY LIVER,W OTHR PROC  10/24/2016         PR RAD RESECTION TUMOR SOFT TIS THIGH/KNEE 5 CM/>  12/08/2014         PR US GUIDANCE, NEEDLE PLACEMENT, RADIOLOGICAL S&I  10/24/2016         REPAIR, HERNIA, OPEN  2013     Social History:  Married, works as a Pension scheme manager.  Former smoker, 10 pack year history.  Occasional drinker (weekly).  Solo care provider for his wife of 32 years who has had a stroke.      Family History: No bleeding disorders or anaesthetic complications.  Father died of throat cancer.      Review of Systems:   Constitutional: No weight loss, night sweats or fevers  HEENT: No epistaxis or gingival bleeding.  CV: No chest pain or palpitations. No dyspnea or orthopnea. Walking limited due to leg pain.    Pulm: No shortness of breath, wheezing or stridor.  Skin: No skin lesions. No rashes or itching.  GI: No anorexia,  nausea, vomiting, abdominal pain, constipation. No bowel movement changes, melena, hematochezia.    GU: No frequency, urgency, nocturia, hematuria, dysuria.  Heme: No swollen lymph nodes.  Infectious: No recent sick contacts or travel.  Endocrine: No heat or cold intolerance.  Musculoskeletal: No bone, muscle or joint pain.  Neuro: No convulsions, seizures, or neurologic dysfunction.    Outpatient Medications:  Prior to Admission medications    Medication Sig Start Date End Date Taking? Authorizing Provider   ASPIRIN PO Take 81 mg by mouth every day.    Patient Reported   aspirin-acetaminophen-caffeine 250-250-65 mg Tablet Take 65 mg by mouth if needed.    Patient Reported   ATORVASTATIN CALCIUM (LIPITOR PO) Take 40 mg by mouth every day at bedtime. Take at 6pm every night.    Patient Reported   CALCIUM CARBONATE/VITAMIN D3 (CALCIUM 500 + D, D3, PO) Take 5,000 Units by mouth every day.    Patient Reported   Diazepam (VALIUM) 10  mg Tablet Take 10 mg by mouth if needed.    Patient Reported   Docusate (COLACE) 100 mg Capsule Take 1 capsule by mouth 2 times daily. While on narcotic pain medication 10/25/16 10/20/17  Ashok Norris Chtchetinin, MD   Ibuprofen (MOTRIN) 600 mg Tablet Take 1 tablet by mouth every 6 hours if needed. take with food 10/25/16 10/20/17  Jana Chtchetinin, MD   LevoTHYROxine (SYNTHROID) 137 mcg tablet Take 1 tablet by mouth every morning. 10/25/16 10/20/17  Ashok Norris Chtchetinin, MD   MULTIVITAMIN PO Take 1 capsule by mouth. 10/23/16   Patient Reported   OMEPRAZOLE (PRILOSEC PO) Take 20 mg by mouth every day at bedtime.    Patient Reported   Pramipexole (MIRAPEX) 0.125 mg Tablet Take 0.125 mg by mouth every day at bedtime.    Patient Reported   VITAMIN D-3 PO Take 500 mg by mouth 2 times daily, before morning and evening meals.    Patient Reported     Allergies  No Known Allergies    VITAL SIGNS / INPUTS & OUTPUTS  BP 122/81  Pulse 93  Temp 36.4 C (97.6 F) (Temporal)  Resp 16  Ht 1.724 m (5' 7.87")  Wt 131.8 kg (290 lb 9.1 oz)  SpO2 98%  BMI 44.34 kg/m2, Body surface area is 2.51 meters squared., Body mass index is 44.34 kg/(m^2).       PHYSICAL EXAM  Alert, oriented, no acute distress  Breathing easily, lungs clear to auscultation bilaterally  Heart with regular rate and rhythm, no murmur  Abdomen obese but soft and non-distended, scars consistent with prior umbilical hernia (no umbilicus), and laparoscopic sleeve gastrectomy    LAB TESTS / IMAGING STUDIES    No results found for this basename: WBC:*,HGB:*,HCT:*,PLT:*,APTT:*,INR:* in the last 72 hours    No results found for this basename: NA:*,K:*,CL:*,CO2:*,BUN:*,CR:*,GLU:*,CA:*,MG:*,PO4:* in the last 72 hours  Lab Results   Lab Name Value Date/Time    AST 23 11/15/2014 03:42 PM    ALT 25 11/15/2014 03:42 PM    ALP 46 11/15/2014 03:42 PM    ALB 4.2 11/15/2014 03:42 PM    TP 6.8 11/15/2014 03:42 PM    TBIL 0.7 11/15/2014 03:42 PM     POC Glucose, blood: --     IMAGING  CT  A/P from 4/18 in outside images, reviewed    MRI Abdomen 5/15  IMPRESSION:  1. Limited exam with nonvisualization of the left and middle hepatic  veins in relation to the liver mass. The left margin of the  mass abuts the  left portal vein, and posterior inferior margin straddles the bifurcation  of the right anterior and left hepatic ducts. The mass appears to be within  but not beyond segments 4A/B, 5 and 8. Recommend multiphasic liver CT for  better localization.  2. Cholelithiasis.    ASSESSMENT/RECOMMENDATION  Mr Potempa is a 71 year old man with a history of leiomyosarcoma of the left lower extremity s/p surgical resection three years ago who presents with oligometastatic disease to the liver involving segments IV, V and VIII.  Given the location of the tumor, this would require a trisegmentectomy, leaving segments VI and VII.  Based on available imaging, this lesion is borderline resectable to non-resectable given the extremely close proximity to the bile ducts, portal veins and arteries leading to segments VI, and VII.  However, will need a 4-phase liver CT to better evaluate the anatomy.  It is possible that with further chemotherapy, the tumor may shrink further, making resection a viable option.  Will plan to discuss this case at liver tumor board and will be in touch with the patient following this discussion.      Thank you for asking [place] to participate in the care of this patient.      Larey Dresser, MD  [facility]  PGY-6, Surgical Oncology  Surg Onc Pager Pager [phone]

## 2017-04-29 NOTE — Nursing Note (Signed)
Patient's name and DOB have been verified, Vital signs taken, allergies verified,screened for pain patient informed about mychart    Sheina Mcleish MA II

## 2017-04-29 NOTE — Progress Notes (Signed)
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  I know you know him well but please allow me to review his history for my records.      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 [facility] with a palpable mass in the lateral aspect of the distal thigh/ proximal lower leg. Biopsy was consistent with leiomyosarcoma. There was no evidence of metastases at presentation. Dr. CAlinda Taylor a wide excision with advancement flaps. Given overall low risk features of primary tumor, adjuvant radiotherapy was not performed. The patient wasunder surveillance since that time when  imaging in July of 2017 revealed a new mass in the liver. This lesion was biopsied laparoscopically by Dr. CAlinda Moneyon 10/24/16 revealing metastatic leiomyosarcoma. Staging studies demonstrated no evidence of additional sites of metastases. [facility] with combination Doxorubicin/ Olaratumab. He has tolerated this therapy well and has no complaints and no side effects from the chemotherapy.     He most recently underwent a liver MRI to determine whether his solitary metastatic disease in the liver is resectable and is referred to my clinic for surgical evaluation. Today, he states that he is fairy symptom-free. He has no abdominal pain and wasn't it for the imaging, he wouldn't even know he has a lesion in his liver.   Besides is obesity and associated fatty liver, he has no other risk factors for underlying liver disease. He never had  hepatitis and never had any obstructive jaundice.       Past Medical History:   Patient Active Problem List   Diagnosis    Leiomyosarcoma    S/P biopsy    Mass of thigh    Malignant neoplasm of connective and soft tissue    History of pulmonary embolism    History of renal failure    Knee joint replacement status    Soft tissue tumor    History of gastric restrictive surgery    H/O umbilical hernia repair    Family history of cancer    Thyroid dysfunction    Mixed hyperlipidemia    Type 2 diabetes mellitus with complication    History of knee surgery    Arthritis, degenerative    Aftercare following surgery for cancer or tumor    Liver mass    Abnormal CT of liver    History of sarcoma    Liver metastasis    Liver metastases    History of cancer chemotherapy     Past Surgical History:  Past Surgical History:   Procedure Laterality Date    ARTHROPLASTY, KNEE, TOTAL Right 2007    GASTRECTOMY, SLEEVE, LAPAROSCOPIC  2014    PR ADJ TISS XFER ANY AREA,30.1-60 SQCM  12/08/2014         PR ADJ TISS XFER ANY AREA,EA ADD 30.0 SQCM  12/08/2014         PR LAP,DIAGNOSTIC ABDOMEN  10/24/2016         PR NEEDLE BIOPSY LIVER,W OTHR  PROC  10/24/2016         PR RAD RESECTION TUMOR SOFT TIS THIGH/KNEE 5 CM/>  12/08/2014         PR US GUIDANCE, NEEDLE PLACEMENT, RADIOLOGICAL S&I  10/24/2016         REPAIR, HERNIA, OPEN  2013     Social History:  Married, works as a Pension scheme manager.  Former smoker, 10 pack year history.  Occasional drinker (weekly). Solo care provider for his wife of 37 years who has had a stroke.      Family History: No bleeding disorders or anaesthetic complications.  Father died of throat cancer.      Review of Systems:   Constitutional: No weight loss, night sweats or fevers  HEENT: No epistaxis or gingival bleeding.  CV: No chest pain or palpitations. No dyspnea or orthopnea. Walking limited due to leg pain.    Pulm: No shortness of breath, wheezing or stridor.  Skin: No skin lesions. No  rashes or itching.  GI: No anorexia, nausea, vomiting, abdominal pain, constipation. No bowel movement changes, melena, hematochezia.    GU: No frequency, urgency, nocturia, hematuria, dysuria.  Heme: No swollen lymph nodes.  Infectious: No recent sick contacts or travel.  Endocrine: No heat or cold intolerance.  Musculoskeletal: No bone, muscle or joint pain.  Neuro: No convulsions, seizures, or neurologic dysfunction.    Outpatient Medications:  Prior to Admission medications    Medication Sig Start Date End Date Taking? Authorizing Provider   ASPIRIN PO Take 81 mg by mouth every day.    Patient Reported   aspirin-acetaminophen-caffeine 250-250-65 mg Tablet Take 65 mg by mouth if needed.    Patient Reported   ATORVASTATIN CALCIUM (LIPITOR PO) Take 40 mg by mouth every day at bedtime. Take at 6pm every night.    Patient Reported   CALCIUM CARBONATE/VITAMIN D3 (CALCIUM 500 + D, D3, PO) Take 5,000 Units by mouth every day.    Patient Reported   Diazepam (VALIUM) 10 mg Tablet Take 10 mg by mouth if needed.    Patient Reported   Docusate (COLACE) 100 mg Capsule Take 1 capsule by mouth 2 times daily. While on narcotic pain medication 10/25/16 10/20/17  Ashok Norris Chtchetinin, MD   Ibuprofen (MOTRIN) 600 mg Tablet Take 1 tablet by mouth every 6 hours if needed. take with food 10/25/16 10/20/17  Jana Chtchetinin, MD   LevoTHYROxine (SYNTHROID) 137 mcg tablet Take 1 tablet by mouth every morning. 10/25/16 10/20/17  Ashok Norris Chtchetinin, MD   MULTIVITAMIN PO Take 1 capsule by mouth. 10/23/16   Patient Reported   OMEPRAZOLE (PRILOSEC PO) Take 20 mg by mouth every day at bedtime.    Patient Reported   Pramipexole (MIRAPEX) 0.125 mg Tablet Take 0.125 mg by mouth every day at bedtime.    Patient Reported   VITAMIN D-3 PO Take 500 mg by mouth 2 times daily, before morning and evening meals.    Patient Reported     Allergies  No Known Allergies    PHYSICAL EXAMINATION  Temp: 36.4 C (97.6 F) (05/21 1015)  Temp src: Temporal (05/21  1015)  Pulse: 93 (05/21 1015)  BP: 122/81 (05/21 1015)  Resp: 16 (05/21 1015)  SpO2: 98 % (05/21 1015)  Height: 172.4 cm (5' 7.87") (05/21 1015)  Weight: 131.8 kg (290 lb 9.1 oz) (05/21 1015)  General: pleasant, comfortable,  well developed, NAD  Head/Face: Atraumatic  Eyes: PERRL, no sclera icterus  ENMT:  MMM, normal nose/ears, no oropharyngeal lesion  Neck: supple, no thyromegaly, no masses    Lymphatics: no palpable adenopathy in neck  CV: RRR, no rub or gallop  Pulm: CTAB  Abd: obese, soft, non-tender, nondistended, scars consistent with prior umbilical hernia (no umbilicus), and laparoscopic sleeve gastrectomy  Ext: no c/c/e, normal ROM in each extremity   Back/spine: no masses/tenderness  Skin: no skin lesion on sun exposed skin, no jaundice  Neuro: alert and oriented x 3  Psych: normal mood and affect     IMAGING  I have personally reviewed and visualized the radiology images individually and interpreted the results myself and have discussed the findings and my interpretation with the patient.    MRI Abdomen 5/15  IMPRESSION:  1. Limited exam with nonvisualization of the left and middle hepatic  veins in relation to the liver mass. The left margin of the mass abuts the  left portal vein, and posterior inferior margin straddles the bifurcation  of the right anterior and left hepatic ducts. The mass appears to be within  but not beyond segments 4A/B, 5 and 8. Recommend multiphasic liver CT for  better localization.  2. Cholelithiasis.    ASSESSMENT/RECOMMENDATION  Louis. Nack is a 71 year old man with a history of leiomyosarcoma of the left lower extremity s/p surgical resection three years ago who presents with a solitary met to his liver involving his central portion on his liver. Given the location of the tumor, this would at least require a trisegmentectomy. However, looking at his MRI, I think the tumor abuts the right posterior hepatic duct with a high chance of leaving a positive margin and is therefore  unresectable. I have discussed with Herbie Baltimore given his morbid obesity and underlying fatty liver, a trisegmentectomy carries a significant operative risk with the most concerning being the risk of liver failure.     We discussed other treatment options including a change in his chemotherapy or radiation (SBRT) in details. I explained that it is possible that with further chemotherapy or SBRT, the tumor may shrink further, making a smaller liver resection a viable option with less morbidity. Olanrewaju understands my concerns and the reasoning against a resection in this particular case. I will also discuss his case at liver tumor board and will be in touch with him following this discussion.      Approximately 60 minutes were spent with the patient, of which more than 50% was spent counseling and/or coordinating care on metastatic leiomyosarcoma to the liver. The patient understands and agrees with the plan of care as outlined.     I have answered all of his questions to the best of my ability and she knows to contact me with any further questions.     Thank you again for this kind referral. Please don't hesitate to contact me with any questions.     Sincererly,     Adrian Prows, MD  Assistant Professor of Surgery  Division of Surgical Oncology  Pager: [phone]  [place]: [phone]                  .

## 2017-05-03 ENCOUNTER — Telehealth: Payer: Self-pay

## 2017-05-03 NOTE — Telephone Encounter (Signed)
Called Dr. Megan Salon to discuss ongoing coordination of care, including recommendation for additional chemotherapy at this time since consensus is that tumor is currently unresectable. No answer, left message requesting return call.      Louis Infante, MD  Associate Professor  Surgical Oncology

## 2017-05-03 NOTE — Telephone Encounter (Signed)
I have also sent a note to Dr. Rosina Lowenstein to see if SBRT would be an option.   Waiting to hear back.     Adrian Prows, MD  Assistant Professor of Surgery  Division of Surgical Oncology  Pager: [phone]  PI: [phone]

## 2017-05-07 ENCOUNTER — Telehealth: Payer: Self-pay | Admitting: Transplant Surgery

## 2017-05-07 NOTE — Telephone Encounter (Signed)
Patient identified by name, DOB, and phone number.    Callers Name/Relationship): the patient    Phone Number/Best Time to Call: [phone] / anytime    Reason For Call: patient would like to know what the decision from the tumor board is for his course of treatment.    Action Requested:  Please Advise Caller.    Thank you

## 2017-05-07 NOTE — Telephone Encounter (Signed)
Request routed to provider.

## 2017-05-08 NOTE — Telephone Encounter (Signed)
Surgical Oncology Attending Note     I have called Louis Taylor and talked to him about the Tumor Board decision.   His tumor is not resectable based on his current imaging and he will require other treatment options including chemotherapy and/or SBRT.     I have discussed the recommendations with our radiation oncologist (Dr. Rosina Lowenstein) who is working on his authorization to see him and have spoken to Dr. [name] who has been in touch with his local oncologist. We will all be in touch as a multidisciplinary team with regards to his treatment plan.     Louis Taylor understands the plan and knows to contact me with any questions.     Adrian Prows, MD  Assistant Professor of Surgery  Division of Surgical Oncology  Pager: [phone]  PI: [phone]

## 2017-05-09 NOTE — Telephone Encounter (Signed)
Patient identified by name, DOB, and phone number.    Callers Name/Relationship): Pt    Phone Number/Best Time to Call: [phone] / anytime    Reason For Call: Pt requesting a call from MD. Pt stated referral for chemotherapy was denied by insurance. Pt thinks this may need to be sent to [facility] for it to be covered.     Action Requested:Please advise Pt      Thank you

## 2017-05-09 NOTE — Telephone Encounter (Signed)
Spoke with pt after ID verified x3.  Per pt, he received a call today from his insurance informing him that his referral for Radiation Oncology at [facility] is denied. They are re-directing it to a in [place]. Pt feels this may be best if it can be done locally d/t travel and distance. Informed pt I will inquire with our authorization coordinator to confirm this information so that we may proceed with the next step. I will then discuss with Dr. Estill Cotta if an appeal will need to be done or if okay for pt to have radiation locally if they offer SBRT. Pt verbalized understanding, expressed appreciation of the call. Will then call pt with plan.    I inquired if pt has had his CT chest done at [facility] yet as ordered by Dr. [name]. Per pt, he has not. Will call [facility] imaging for an update.    Spoke with Stanton Kidney at [facility]. Pt was originally scheduled on 04/27/17, but no recent serum creatinine was done within 30 days. Pt only had urine creatinine done at [facility]. Pt then informed them he'll have it done at [facility] instead. Therefore, they closed the order. Informed that this is not correct, pt was auth to have done at their location. No plans to have CT done here. I requested that his order be re-opened as I will have the pt complete labs and instruct pt to call them to schedule the scan once the labs are done. Stanton Kidney re-opened the order and placed a note with directive of the agreement above.     I checked on [facility] lab results, pt's labs in past 30 days that were done were Hgb1c, creatinine urine, TSH, and lipid panel.     Spoke with pt after ID verified x3.  Informed pt labs will need to be done prior to having CT scheduled as they require creatinine level within 30 days of scheduled scan. It appears Dr. [name] did want further labs done as well as ordered from his last OV (which includes a CMP). Pt would like orders faxed to [facility], will fax orders. Instructed pt to have labs  done, once done to please call [facility] to schedule CT chest and inform them the date he had the labs done. Pt verbalized understanding, in agreement with plan. Pt confirmed he has [place] scheduling phone number.    Lab orders Dr. [name] ordered faxed to [facility] in [place] at [phone] with confirmation fax received.  Staff message sent to [facility] to inquire update on auth.     Paralee Cancel, RN

## 2017-05-15 NOTE — Telephone Encounter (Addendum)
Spoke with pt after ID verified x3.  Informed pt as his insurance denied an office visit with Radiation Oncology at [facility], his referral was re-directed to [facility]. Nearest facility to pt is in [facility] which is a hour away from pt. Pt would like to be seen locally which pt confirmed there is a Rad Onc facility in [place]. Informed pt per our [place] coordinator, we can contact pt's PCP, Dr. Beatris Si, to help assist with the referral. Pt expressed desire for this route as again he would prefer a local facility to minimize travel.     Faxed recent office visit notes and referral/auth note of [place] Rad [facility] denial to Dr. Valetta Fuller office. Requested for Dr. Lennie Odor to assist with Rad Onc referral to [facility] by placing referral as our referral was re-directed to [facility].   Spoke with Judson Roch at Dr. Valetta Fuller office, informed of the above information and that I have faxed pertinent records with request for Dr. Lennie Odor to review.     Pt confirmed he is scheduled for his CT chest tomorrow at [facility].    Paralee Cancel, RN

## 2017-05-21 ENCOUNTER — Telehealth: Payer: Self-pay | Admitting: Transplant Surgery

## 2017-05-21 NOTE — Progress Notes (Signed)
.  Received CD from [place], [place], [phone] on 05/21/2017. CD contains CT CHEST W CON & CT ABDOMEN WO&W CON. DOS:  CT CHEST W CON: 05/16/2017, need to look in img under duplicate pt id. Under date 05-21-2017  CT ABDOMEN WO&W CON: 03/27/2017    Uploaded and archived CD. Returned to [employer].

## 2017-05-21 NOTE — Telephone Encounter (Signed)
Patient Identified by name, DOB, and phone number.     Callers Name & Relationship:Pt     Phone Number & Best Time to [place]    Reason For Call:Pt is requesting to speak to the MD/RN to go over his scan results.    Action Requested:Please advise caller.    Thank You

## 2017-05-23 NOTE — Telephone Encounter (Signed)
I do not know what this is about.      Crist Infante, MD  Associate Professor  Surgical Oncology

## 2017-05-23 NOTE — Telephone Encounter (Signed)
Patient's Insurance redirected to [employer] for radiation treatment.    Mahalia Longest, RN

## 2017-05-23 NOTE — Telephone Encounter (Signed)
Thanks. I do not remember seeing the results. Will review and contact the patient.    The patient was referred to Dr. Rosina Lowenstein on 5/28. It is now 6/14 with no appointment scheduled. Please advise.      Crist Infante, MD  Associate Professor  Surgical Oncology

## 2017-05-23 NOTE — Telephone Encounter (Signed)
Pt called and stated he has not heard back from the MD and is requesting a call back as soon as possible.    Please advise    Thank you,  Marilynne Drivers  [facility]

## 2017-05-23 NOTE — Telephone Encounter (Signed)
He's calling regarding his CT chest results he had done at [facility] that you ordered to r/o lung mets. Selinda Eon had received the report for you to review then sent it to be scanned, but it hasn't been scanned yet in pt's chart. Dr. Estill Cotta or I didn't get a chance to review it yet, but will request another copy of it.    Paralee Cancel, RN

## 2017-06-04 ENCOUNTER — Telehealth: Payer: Self-pay

## 2017-06-04 NOTE — Telephone Encounter (Signed)
Called patient to discuss CT chest results. Informed him that there is no evidence of pulmonary metastases. He verbalized understanding. He was thankful for call.      Crist Infante, MD  Associate Professor  Surgical Oncology

## 2017-06-06 ENCOUNTER — Telehealth: Payer: Self-pay

## 2017-06-06 DIAGNOSIS — C499 Malignant neoplasm of connective and soft tissue, unspecified: Principal | ICD-10-CM

## 2017-06-06 DIAGNOSIS — C787 Secondary malignant neoplasm of liver and intrahepatic bile duct: Secondary | ICD-10-CM

## 2017-06-06 NOTE — Telephone Encounter (Signed)
Referral placed. Thank you very much.      Crist Infante, MD  Associate Professor  Surgical Oncology

## 2017-06-06 NOTE — Telephone Encounter (Signed)
Dr. [name]-    Please place referral. We will be on the lookout.     Thanks.    Angelita Ingles, MD  Radiation Oncology  PGY-5  [place]  Pager: [phone]  PI #: [phone]

## 2017-06-06 NOTE — Telephone Encounter (Signed)
Patient Identified by name, DOB, and phone number.     Callers Name & Relationship:pt     Phone Number & Best Time to [place]    Reason For Call:Pt stated that he needs to speak to the RN to see if the CC MD has determined which MD he should go to see.  Pt stated that the staff should be aware of his situation.    Action Requested:Please advise caller.    Thank You

## 2017-06-06 NOTE — Telephone Encounter (Signed)
Louis Taylor, looks like Dr. [name] already called patient back and discussed his CT of chest results.    Gar Gibbon Destini Cambre, Therapist, sports

## 2017-06-06 NOTE — Telephone Encounter (Signed)
Called [facility] number listed in referral that referral was to be sent to, [phone], spoke with Vaughan Basta in the [employer]. Confirmed their fax number [phone], they never received the urgent referral.    Reviewed URGENT Radiation Oncology referral documentation provided by [facility] and auth letter with referral; referral was redirected to Dr. Ruben Gottron, [facility] in [place], not [facility] as referral was faxed to.  Faxed the following documents to Dr. Carren Rang office in [place]; ph [phone], fax [phone]: Face Sheet, URGENT Radonc referral from [facility], Office Progress note from Dr. Estill Cotta 04/29/17, and MRI abdomen 04/23/17.    Called patient. Verified patient first name, last name, and date of birth at initiation of contact.  Apologized to patient for delay in his radiation referral. Informed him I faxed his referral with all supporting documentation to Dr. Casey Burkitt office. Provided patient with phone number and address. He was very thankful for call and update along with radonc Provider information.    Called Dr. Casey Burkitt office, spoke with Levada Dy. Verified they have received the referral faxed this morning. I thanked her for verifying.    Mahalia Longest, RN

## 2017-06-06 NOTE — Telephone Encounter (Signed)
Spoke to patient's medical oncologist, Dr. Delorise Shiner. According to Dr. Megan Salon, patient has been seen by local Radiation Oncologist, Dr. Lorrin Mais, who declined to offer the patient SBRT and instead recommended theraspheres. We have reviewed patient's case in [place] Tumor Board on multiple occasions. We feel that he is a good candidate for SBRT. Furthermore, we feel that SBRT is a better cancer treatment option for the patient in terms of disease control and long term outcome. My recommendation is to have the patient referred and authorized to see Dr. Rosina Lowenstein of [facility] radiotherapy as previously recommended. Thank you very much.      Crist Infante, MD  Associate Professor  Surgical Oncology

## 2017-06-06 NOTE — Telephone Encounter (Signed)
-----   Message from Dannette Barbara, MD sent at 06/04/2017  6:48 PM PDT -----  Regarding: RT appeal  Hi Selinda Eon,    Can you please find out whom to call to contest insurance company's decision to redirect Mr. Poorman regarding SBRT? In my judgement, this is not something that can be administered in the community, and patient has yet to be seen anyway! Thank you very much.    Mikki Santee

## 2017-06-06 NOTE — Addendum Note (Signed)
Addended by: Racheal Patches on: 06/06/2017 03:24 PM     Modules accepted: Orders

## 2017-06-13 NOTE — Telephone Encounter (Signed)
Authorization is still pending. We should have a decision by tomorrow, 06/14/17 is their last business day.

## 2017-06-13 NOTE — Telephone Encounter (Signed)
Jamila - please update on status of radonc referral. Thanks.    Mahalia Longest, RN

## 2017-06-14 ENCOUNTER — Telehealth: Payer: Self-pay

## 2017-06-14 NOTE — Telephone Encounter (Signed)
-----   Message from [facility] sent at 06/13/2017  3:40 PM PDT -----  Regarding: Rad/Onc Referral  Hello Dr. [name],    The Rad/Onc referral was denied. The denial states you can call and speak with  "Dr. Toniann Ket at [phone]. You will be prompted to leave a detailed message so that we can research and coordinate your return call appropriately."      Hazle Coca  [phone]

## 2017-06-17 ENCOUNTER — Telehealth: Payer: Self-pay

## 2017-06-17 NOTE — Telephone Encounter (Signed)
----- Message from Mahalia Longest, RN sent at 06/17/2017  2:53 PM PDT -----  Regarding: RE: Rad/Onc Referral  Hi Jamila    I placed the note from [facility] in your Fax que folder stating they will not see the patient (fax dated 06/17/17). Please process appeal with his insurance as [facility] will not see patient. Thanks!    Selinda Eon    ----- Message -----     From: Adrian Prows, MD     Sent: 06/17/2017   2:47 PM       To: Mahalia Longest, RN, Crist Infante, MD, #  Subject: RE: Rad/Onc Referral                             Agree  - not sure where the miscommunication is here but I had called before as well.     Will call again       ----- Message -----     From: Crist Infante, MD     Sent: 06/17/2017   1:01 PM       To: Adrian Prows, MD, Mahalia Longest, RN, #  Subject: RE: Rad/Onc Referral                             This is completely ridiculous because the patient was referred to [place] who refused to treat with SBRT and wanted the patient referred for Theraspheres which are not indicated. Thank you very much.      Crist Infante, MD  Associate Professor  Surgical Oncology    ----- Message -----     From: Mahalia Longest, RN     Sent: 06/17/2017   8:21 AM       To: Adrian Prows, MD, Mahalia Longest, RN, #  Subject: FW: Rad/Onc Referral                             FYI. Patient's referral has been denied again for internal radonc because [facility] can provide SBRT.     Dr. Malcolm Metro - Dr. [name] is out of the office this week. Please call the number at the bottom of message to appeal, to help expedite. Thanks.    Mahalia Longest, RN    ----- Message -----     From: Geralynn Ochs     Sent: 06/17/2017   8:12 AM       To: Mahalia Longest, RN  Subject: RE: Rad/Onc Referral                             Hello Selinda Eon,    It was denied for the same reason the previous request was denied. The services can be provided by an in network provider. Per [facility] Rad/Onc SBRT can be performed at their [facility] location. I have not  received anything stating we can appeal yet. But the denial stated the treating physician can call the number below to discuss the decision.    Hazle Coca  [phone]  ----- Message -----     From: Mahalia Longest, RN     Sent: 06/13/2017   3:50 PM       To: Mahalia Longest, RN, Dannette Barbara, MD, #  Subject: RE: Rad/Onc Referral  Hi Jamila,    Why was the referral denied?    Selinda Eon  ----- Message -----     From: Geralynn Ochs     Sent: 06/13/2017   3:40 PM       To: Mahalia Longest, RN, Dannette Barbara, MD  Subject: Rad/Onc Referral                                 Hello Dr. [name],    The Rad/Onc referral was denied. The denial states you can call and speak with  "Dr. Toniann Ket at [phone]. You will be prompted to leave a detailed message so that we can research and coordinate your return call appropriately."      Hazle Coca  [phone]

## 2017-06-17 NOTE — Telephone Encounter (Signed)
-----   Message from Mahalia Longest, RN sent at 06/17/2017  8:21 AM PDT -----  Regarding: FW: Rad/Onc Referral  FYI. Patient's referral has been denied again for internal radonc because [facility] can provide SBRT.     Dr. Malcolm Metro - Dr. [name] is out of the office this week. Please call the number at the bottom of message to appeal, to help expedite. Thanks.    Mahalia Longest, RN    ----- Message -----     From: Geralynn Ochs     Sent: 06/17/2017   8:12 AM       To: Mahalia Longest, RN  Subject: RE: Rad/Onc Referral                             Hello Selinda Eon,    It was denied for the same reason the previous request was denied. The services can be provided by an in network provider. Per [facility] Rad/Onc SBRT can be performed at their [facility] location. I have not received anything stating we can appeal yet. But the denial stated the treating physician can call the number below to discuss the decision.    Hazle Coca  [phone]  ----- Message -----     From: Mahalia Longest, RN     Sent: 06/13/2017   3:50 PM       To: Mahalia Longest, RN, Dannette Barbara, MD, #  Subject: RE: Rad/Onc Referral                             Hi Jamila,    Why was the referral denied?    Selinda Eon  ----- Message -----     From: Geralynn Ochs     Sent: 06/13/2017   3:40 PM       To: Mahalia Longest, RN, Dannette Barbara, MD  Subject: Rad/Onc Referral                                 Hello Dr. [name],    The Rad/Onc referral was denied. The denial states you can call and speak with  "Dr. Toniann Ket at [phone]. You will be prompted to leave a detailed message so that we can research and coordinate your return call appropriately."      Hazle Coca  [phone]

## 2017-06-17 NOTE — Telephone Encounter (Signed)
-----   Message from Crist Infante, MD sent at 06/17/2017  1:01 PM PDT -----  Regarding: RE: Rad/Onc Referral  This is completely ridiculous because the patient was referred to [place] who refused to treat with SBRT and wanted the patient referred for Theraspheres which are not indicated. Thank you very much.      Crist Infante, MD  Associate Professor  Surgical Oncology    ----- Message -----     From: Mahalia Longest, RN     Sent: 06/17/2017   8:21 AM       To: Adrian Prows, MD, Mahalia Longest, RN, #  Subject: FW: Rad/Onc Referral                             FYI. Patient's referral has been denied again for internal radonc because [facility] can provide SBRT.     Dr. Malcolm Metro - Dr. [name] is out of the office this week. Please call the number at the bottom of message to appeal, to help expedite. Thanks.    Mahalia Longest, RN    ----- Message -----     From: Geralynn Ochs     Sent: 06/17/2017   8:12 AM       To: Mahalia Longest, RN  Subject: RE: Rad/Onc Referral                             Hello Selinda Eon,    It was denied for the same reason the previous request was denied. The services can be provided by an in network provider. Per [facility] Rad/Onc SBRT can be performed at their [facility] location. I have not received anything stating we can appeal yet. But the denial stated the treating physician can call the number below to discuss the decision.    Hazle Coca  [phone]  ----- Message -----     From: Mahalia Longest, RN     Sent: 06/13/2017   3:50 PM       To: Mahalia Longest, RN, Dannette Barbara, MD, #  Subject: RE: Rad/Onc Referral                             Hi Jamila,    Why was the referral denied?    Selinda Eon  ----- Message -----     From: Geralynn Ochs     Sent: 06/13/2017   3:40 PM       To: Mahalia Longest, RN, Dannette Barbara, MD  Subject: Rad/Onc Referral                                 Hello Dr. [name],    The Rad/Onc referral was denied. The denial states you can call and speak with  "Dr. Toniann Ket at  [phone]. You will be prompted to leave a detailed message so that we can research and coordinate your return call appropriately."      Hazle Coca  [phone]

## 2017-06-24 ENCOUNTER — Telehealth: Payer: Self-pay | Admitting: Transplant Surgery

## 2017-06-24 NOTE — Telephone Encounter (Signed)
Called Dr. Toniann Ket at [phone] again to discuss denial for SBRT for Mr. Amick.     Left a detailed message with call back and purpose of call.    Adrian Prows, MD  Assistant Professor of Surgery  Division of Surgical Oncology  Pager: [phone]  PI: [phone]

## 2017-06-24 NOTE — Telephone Encounter (Signed)
----- Message from Mahalia Longest, RN sent at 06/17/2017  2:53 PM PDT -----  Regarding: RE: Rad/Onc Referral  Hi Jamila    I placed the note from [facility] in your Fax que folder stating they will not see the patient (fax dated 06/17/17). Please process appeal with his insurance as [facility] will not see patient. Thanks!    Selinda Eon    ----- Message -----     From: Adrian Prows, MD     Sent: 06/17/2017   2:47 PM       To: Mahalia Longest, RN, Crist Infante, MD, #  Subject: RE: Rad/Onc Referral                             Agree  - not sure where the miscommunication is here but I had called before as well.     Will call again       ----- Message -----     From: Crist Infante, MD     Sent: 06/17/2017   1:01 PM       To: Adrian Prows, MD, Mahalia Longest, RN, #  Subject: RE: Rad/Onc Referral                             This is completely ridiculous because the patient was referred to [place] who refused to treat with SBRT and wanted the patient referred for Theraspheres which are not indicated. Thank you very much.      Crist Infante, MD  Associate Professor  Surgical Oncology    ----- Message -----     From: Mahalia Longest, RN     Sent: 06/17/2017   8:21 AM       To: Adrian Prows, MD, Mahalia Longest, RN, #  Subject: FW: Rad/Onc Referral                             FYI. Patient's referral has been denied again for internal radonc because [facility] can provide SBRT.     Dr. Malcolm Metro - Dr. [name] is out of the office this week. Please call the number at the bottom of message to appeal, to help expedite. Thanks.    Mahalia Longest, RN    ----- Message -----     From: Geralynn Ochs     Sent: 06/17/2017   8:12 AM       To: Mahalia Longest, RN  Subject: RE: Rad/Onc Referral                             Hello Selinda Eon,    It was denied for the same reason the previous request was denied. The services can be provided by an in network provider. Per [facility] Rad/Onc SBRT can be performed at their [facility] location. I have not  received anything stating we can appeal yet. But the denial stated the treating physician can call the number below to discuss the decision.    Hazle Coca  [phone]  ----- Message -----     From: Mahalia Longest, RN     Sent: 06/13/2017   3:50 PM       To: Mahalia Longest, RN, Dannette Barbara, MD, #  Subject: RE: Rad/Onc Referral  Hi Jamila,    Why was the referral denied?    Selinda Eon  ----- Message -----     From: Geralynn Ochs     Sent: 06/13/2017   3:40 PM       To: Mahalia Longest, RN, Dannette Barbara, MD  Subject: Rad/Onc Referral                                 Hello Dr. [name],    The Rad/Onc referral was denied. The denial states you can call and speak with  "Dr. Toniann Ket at [phone]. You will be prompted to leave a detailed message so that we can research and coordinate your return call appropriately."      Hazle Coca  [phone]

## 2017-06-26 NOTE — Telephone Encounter (Signed)
Spoke with Constance Holster at [facility]. She informed me that Dr. Daphine Deutscher does not work at their location. They stated her works out of his own office. They stated the only thing we can do is continue to call him and leave message. I will continue to do what I can on this end.      Nelly Rout

## 2017-06-27 ENCOUNTER — Telehealth: Payer: Self-pay | Admitting: Transplant Surgery

## 2017-06-27 NOTE — Telephone Encounter (Signed)
Can't they re-assign another reviewer of the case given their policy of '5 days to return a phone call?' We can't just keep waiting on him and delaying patient's are.     It has been well over a week and I have left several messages.     Thanks

## 2017-07-02 ENCOUNTER — Telehealth: Payer: Self-pay

## 2017-07-02 NOTE — Telephone Encounter (Signed)
----- Message from Mahalia Longest, RN sent at 07/02/2017 12:06 PM PDT -----  Regarding: FW: Rad/Onc Referral  Fyi...  Patient finally received radonc auth!    ----- Message -----     From: Geralynn Ochs     Sent: 07/02/2017  10:42 AM       To: Adrian Prows, MD, Mahalia Longest, RN, #  Subject: RE: Rad/Onc Referral                             Hello Dr. Estill Cotta,    We finally have an authorization for Rad/onc. The referral is updated.    Hazle Coca        ----- Message -----     From: Adrian Prows, MD     Sent: 06/24/2017   4:48 PM       To: Mahalia Longest, RN, Geralynn Ochs, #  Subject: RE: Rad/Onc Referral                             Hi - any update on this?   I have not received a call back from the insurance to fight the denial.   Can any of you help get a hold of them and call me on my cell please?    Thanks    ----- Message -----     From: Mahalia Longest, RN     Sent: 06/17/2017   2:53 PM       To: Adrian Prows, MD, Mahalia Longest, RN, #  Subject: RE: Rad/Onc Referral                             Hi Jamila    I placed the note from [facility] in your Fax que folder stating they will not see the patient (fax dated 06/17/17). Please process appeal with his insurance as [facility] will not see patient. Thanks!    Selinda Eon    ----- Message -----     From: Adrian Prows, MD     Sent: 06/17/2017   2:47 PM       To: Mahalia Longest, RN, Crist Infante, MD, #  Subject: RE: Rad/Onc Referral                             Agree  - not sure where the miscommunication is here but I had called before as well.     Will call again       ----- Message -----     From: Crist Infante, MD     Sent: 06/17/2017   1:01 PM       To: Adrian Prows, MD, Mahalia Longest, RN, #  Subject: RE: Rad/Onc Referral                             This is completely ridiculous because the patient was referred to [place] who refused to treat with SBRT and wanted the patient referred for Theraspheres which are not indicated. Thank you very  much.      Crist Infante, MD  Associate Professor  Surgical Oncology    ----- Message -----     From: Mahalia Longest, RN     Sent: 06/17/2017   8:21 AM       To:  Adrian Prows, MD, Mahalia Longest, RN, #  Subject: Melton Alar: Rad/Onc Referral                             FYI. Patient's referral has been denied again for internal radonc because [facility] can provide SBRT.     Dr. Malcolm Metro - Dr. [name] is out of the office this week. Please call the number at the bottom of message to appeal, to help expedite. Thanks.    Mahalia Longest, RN    ----- Message -----     From: Geralynn Ochs     Sent: 06/17/2017   8:12 AM       To: Mahalia Longest, RN  Subject: RE: Rad/Onc Referral                             Hello Selinda Eon,    It was denied for the same reason the previous request was denied. The services can be provided by an in network provider. Per [facility] Rad/Onc SBRT can be performed at their [facility] location. I have not received anything stating we can appeal yet. But the denial stated the treating physician can call the number below to discuss the decision.    Hazle Coca  [phone]  ----- Message -----     From: Mahalia Longest, RN     Sent: 06/13/2017   3:50 PM       To: Mahalia Longest, RN, Dannette Barbara, MD, #  Subject: RE: Rad/Onc Referral                             Hi Jamila,    Why was the referral denied?    Selinda Eon  ----- Message -----     From: Geralynn Ochs     Sent: 06/13/2017   3:40 PM       To: Mahalia Longest, RN, Dannette Barbara, MD  Subject: Rad/Onc Referral                                 Hello Dr. [name],    The Rad/Onc referral was denied. The denial states you can call and speak with  "Dr. Toniann Ket at [phone]. You will be prompted to leave a detailed message so that we can research and coordinate your return call appropriately."      Hazle Coca  [phone]

## 2017-07-04 ENCOUNTER — Telehealth: Payer: Self-pay | Admitting: Transplant Surgery

## 2017-07-04 ENCOUNTER — Telehealth: Payer: Self-pay | Admitting: Radiation Oncology

## 2017-07-04 ENCOUNTER — Telehealth: Payer: Self-pay

## 2017-07-04 NOTE — Telephone Encounter (Signed)
----- Message from Mahalia Longest, RN sent at 07/04/2017  8:43 AM PDT -----  Regarding: RE: Rad/Onc Referral  Radonc will not schedule patient until they have the LOA from his insurance. We can follow up with radonc to ask if they have it, but we are still waiting on insurance....    Selinda Eon    ----- Message -----     From: Adrian Prows, MD     Sent: 07/03/2017   4:48 PM       To: Mahalia Longest, RN, Geralynn Ochs, #  Subject: RE: Rad/Onc Referral                             Can we please follow up that he is scheduled asap with our rad onc?   Thanks so much ladies   SG    ----- Message -----     From: Geralynn Ochs     Sent: 07/02/2017  10:42 AM       To: Adrian Prows, MD, Mahalia Longest, RN, #  Subject: RE: Rad/Onc Referral                             Hello Dr. Estill Cotta,    We finally have an authorization for Rad/onc. The referral is updated.    Hazle Coca        ----- Message -----     From: Adrian Prows, MD     Sent: 06/24/2017   4:48 PM       To: Mahalia Longest, RN, Geralynn Ochs, #  Subject: RE: Rad/Onc Referral                             Hi - any update on this?   I have not received a call back from the insurance to fight the denial.   Can any of you help get a hold of them and call me on my cell please?    Thanks    ----- Message -----     From: Mahalia Longest, RN     Sent: 06/17/2017   2:53 PM       To: Adrian Prows, MD, Mahalia Longest, RN, #  Subject: RE: Rad/Onc Referral                             Hi Jamila    I placed the note from [facility] in your Fax que folder stating they will not see the patient (fax dated 06/17/17). Please process appeal with his insurance as [facility] will not see patient. Thanks!    Selinda Eon    ----- Message -----     From: Adrian Prows, MD     Sent: 06/17/2017   2:47 PM       To: Mahalia Longest, RN, Crist Infante, MD, #  Subject: RE: Rad/Onc Referral                             Agree  - not sure where the miscommunication is here but I had called before as well.     Will  call again       ----- Message -----     From: Crist Infante, MD     Sent: 06/17/2017   1:01 PM  To: Adrian Prows, MD, Mahalia Longest, RN, #  Subject: RE: Rad/Onc Referral                             This is completely ridiculous because the patient was referred to [place] who refused to treat with SBRT and wanted the patient referred for Theraspheres which are not indicated. Thank you very much.      Crist Infante, MD  Associate Professor  Surgical Oncology    ----- Message -----     From: Mahalia Longest, RN     Sent: 06/17/2017   8:21 AM       To: Adrian Prows, MD, Mahalia Longest, RN, #  Subject: FW: Rad/Onc Referral                             FYI. Patient's referral has been denied again for internal radonc because [facility] can provide SBRT.     Dr. Malcolm Metro - Dr. [name] is out of the office this week. Please call the number at the bottom of message to appeal, to help expedite. Thanks.    Mahalia Longest, RN    ----- Message -----     From: Geralynn Ochs     Sent: 06/17/2017   8:12 AM       To: Mahalia Longest, RN  Subject: RE: Rad/Onc Referral                             Hello Selinda Eon,    It was denied for the same reason the previous request was denied. The services can be provided by an in network provider. Per [facility] Rad/Onc SBRT can be performed at their [facility] location. I have not received anything stating we can appeal yet. But the denial stated the treating physician can call the number below to discuss the decision.    Hazle Coca  [phone]  ----- Message -----     From: Mahalia Longest, RN     Sent: 06/13/2017   3:50 PM       To: Mahalia Longest, RN, Dannette Barbara, MD, #  Subject: RE: Rad/Onc Referral                             Hi Jamila,    Why was the referral denied?    Selinda Eon  ----- Message -----     From: Geralynn Ochs     Sent: 06/13/2017   3:40 PM       To: Mahalia Longest, RN, Dannette Barbara, MD  Subject: Rad/Onc Referral                                 Hello Dr. [name],    The  Rad/Onc referral was denied. The denial states you can call and speak with  "Dr. Toniann Ket at [phone]. You will be prompted to leave a detailed message so that we can research and coordinate your return call appropriately."      Hazle Coca  [phone]

## 2017-07-04 NOTE — Telephone Encounter (Signed)
We received a referral for Louis Taylor (patient referred for Liver mets to Dr. Rosina Lowenstein for consult on 06/06/17).    I contacted the patient to schedule appointment for 07/09/17; per patient he was seen by Dr. Earley Favor at [facility] and declined appointment at [facility].    Patient was hesitant to provide additional information about his visit as he had already established care at [facility].    Upon contacting [facility], they do no have this patient in their system and Dr. Earley Favor is a Radiologist?    Per patient request, I have closed this referral out.  Please clarify/contact patient for further information.    Thank you    Joesphine Bare  [phone]

## 2017-07-04 NOTE — Telephone Encounter (Signed)
Spoke with rad/onc new pt referral. Pt to be scheduled to see Dr. Rosina Lowenstein 07/09/17. Appt to be done today.      Morene Antu, Float RN

## 2017-07-04 NOTE — Telephone Encounter (Signed)
-----   Message from Crist Infante, MD sent at 07/04/2017 11:59 AM PDT -----  Regarding: RE: NP Referral Closed  Thank you very much for the information. It sounds like the patient may be seeing an IR physician for theraspheres since Dr. Thornell Mule at [facility] said he was not a candidate for SBRT and should have that treatment. I think we all agree that approach is not consistent with our recommendations for the patient, which is unfortunate.       Crist Infante, MD  Professor  Surgical Oncology     ----- Message -----     From: Joesphine Bare     Sent: 07/04/2017  10:22 AM       To: Adrian Prows, MD, Mahalia Longest, RN, #  Subject: NP Referral Closed                               Good morning Dr. [name],    We received a referral for Louis Taylor (patient referred for Liver mets to Dr. Rosina Lowenstein for consult on 06/06/17).    I contacted the patient to schedule appointment for 07/09/17; per patient he was seen by Dr. Earley Favor at [facility] and declined appointment at [facility].    Patient was hesitant to provide additional information about his visit as he had already established care at [facility].    Upon contacting [facility], they do no have this patient in their system and Dr. Earley Favor is a Radiologist?    Per patient request, I have closed this referral out.  Please clarify/contact patient for further clarification.    Thank you  Joesphine Bare  [phone]

## 2017-07-04 NOTE — Telephone Encounter (Signed)
-----   Message from [facility] sent at 07/04/2017 10:22 AM PDT -----  Regarding: NP Referral Closed  Good morning Dr. [name],    We received a referral for Louis Taylor (patient referred for Liver mets to Dr. Rosina Lowenstein for consult on 06/06/17).    I contacted the patient to schedule appointment for 07/09/17; per patient he was seen by Dr. Earley Favor at [facility] and declined appointment at [facility].    Patient was hesitant to provide additional information about his visit as he had already established care at [facility].    Upon contacting [facility], they do no have this patient in their system and Dr. Earley Favor is a Radiologist?    Per patient request, I have closed this referral out.  Please clarify/contact patient for further clarification.    Thank you  Joesphine Bare  [phone]

## 2017-07-05 ENCOUNTER — Telehealth: Payer: Self-pay

## 2017-07-05 NOTE — Telephone Encounter (Signed)
----- Message from Adrian Prows, MD sent at 07/05/2017 10:53 AM PDT -----  Regarding: RE: Rad/Onc Referral  Thanks!   ----- Message -----     From: Morene Antu, RN     Sent: 07/04/2017  10:00 AM       To: Adrian Prows, MD, Mahalia Longest, RN, #  Subject: RE: Rad/Onc Referral                             Good morning,    Spoke with rad/onc new pt referral. Pt to be scheduled to see Dr. Rosina Lowenstein 07/09/17. Appt to be made today.      Morene Antu, Float RN      ----- Message -----     From: Mahalia Longest, RN     Sent: 07/04/2017   8:43 AM       To: Adrian Prows, MD, Geralynn Ochs, #  Subject: RE: Rad/Onc Referral                             [facility] will not schedule patient until they have the LOA from his insurance. We can follow up with radonc to ask if they have it, but we are still waiting on insurance....    Selinda Eon    ----- Message -----     From: Adrian Prows, MD     Sent: 07/03/2017   4:48 PM       To: Mahalia Longest, RN, Geralynn Ochs, #  Subject: RE: Rad/Onc Referral                             Can we please follow up that he is scheduled asap with our rad onc?   Thanks so much ladies   SG    ----- Message -----     From: Geralynn Ochs     Sent: 07/02/2017  10:42 AM       To: Adrian Prows, MD, Mahalia Longest, RN, #  Subject: RE: Rad/Onc Referral                             Hello Dr. Estill Cotta,    We finally have an authorization for Rad/onc. The referral is updated.    Hazle Coca        ----- Message -----     From: Adrian Prows, MD     Sent: 06/24/2017   4:48 PM       To: Mahalia Longest, RN, Geralynn Ochs, #  Subject: RE: Rad/Onc Referral                             Hi - any update on this?   I have not received a call back from the insurance to fight the denial.   Can any of you help get a hold of them and call me on my cell please?    Thanks    ----- Message -----     From: Mahalia Longest, RN     Sent: 06/17/2017   2:53 PM       To: Adrian Prows, MD, Mahalia Longest, RN, #  Subject: RE: Rad/Onc  Referral  Hi Jamila    I placed the note from [facility] in your Fax que folder stating they will not see the patient (fax dated 06/17/17). Please process appeal with his insurance as [facility] will not see patient. Thanks!    Selinda Eon    ----- Message -----     From: Adrian Prows, MD     Sent: 06/17/2017   2:47 PM       To: Mahalia Longest, RN, Crist Infante, MD, #  Subject: RE: Rad/Onc Referral                             Agree  - not sure where the miscommunication is here but I had called before as well.     Will call again       ----- Message -----     From: Crist Infante, MD     Sent: 06/17/2017   1:01 PM       To: Adrian Prows, MD, Mahalia Longest, RN, #  Subject: RE: Rad/Onc Referral                             This is completely ridiculous because the patient was referred to [facility] who refused to treat with SBRT and wanted the patient referred for Theraspheres which are not indicated. Thank you very much.      Crist Infante, MD  Associate Professor  Surgical Oncology    ----- Message -----     From: Mahalia Longest, RN     Sent: 06/17/2017   8:21 AM       To: Adrian Prows, MD, Mahalia Longest, RN, #  Subject: FW: Rad/Onc Referral                             FYI. Patient's referral has been denied again for internal radonc because [facility] can provide SBRT.     Dr. Malcolm Metro - Dr. [name] is out of the office this week. Please call the number at the bottom of message to appeal, to help expedite. Thanks.    Mahalia Longest, RN    ----- Message -----     From: Geralynn Ochs     Sent: 06/17/2017   8:12 AM       To: Mahalia Longest, RN  Subject: RE: Rad/Onc Referral                             Hello Selinda Eon,    It was denied for the same reason the previous request was denied. The services can be provided by an in network provider. Per [facility] SBRT can be performed at their [facility] location. I have not received anything stating we can appeal yet. But the denial stated the  treating physician can call the number below to discuss the decision.    Hazle Coca  [phone]  ----- Message -----     From: Mahalia Longest, RN     Sent: 06/13/2017   3:50 PM       To: Mahalia Longest, RN, Dannette Barbara, MD, #  Subject: RE: Rad/Onc Referral                             Hi Jamila,    Why was the  referral denied?    Selinda Eon  ----- Message -----     From: Geralynn Ochs     Sent: 06/13/2017   3:40 PM       To: Mahalia Longest, RN, Dannette Barbara, MD  Subject: Rad/Onc Referral                                 Hello Dr. [name],    The Rad/Onc referral was denied. The denial states you can call and speak with  "Dr. Toniann Ket at [phone]. You will be prompted to leave a detailed message so that we can research and coordinate your return call appropriately."      Hazle Coca  [phone]

## 2018-11-09 DEATH — deceased

## 2019-01-14 IMAGING — CT CT ABDOMEN PELVIS W CONTRAST
2 of 4 series · 16 of 46 positions shown, 18 images · IV contrast (CONTRAST)
Comparison: none

CT OF THE ABDOMEN AND PELVIS WITH CONTRAST:
CLINICAL INDICATION:  Secondary malignant neoplasm of bone (CMS/HCC).Prostate carcinoma 10 years ago. Question metastases.
REFERENCE:   Bone scan 01/17/2009, right hip 09/04/2012, MRI pelvis 08/30/2014
TECHNIQUE: Informed consent was obtained for the administration of contrast.  Multiple axial images were obtained from the lung bases through the upper thighs following the uneventful administration of oral and IV contrast.
CT DOSE: 1596.1 mGy/cm

[Series 5: with · axial · 0.77mm/px · z∈[-657,-242]mm · 13 of 186 slices shown, 15 images]
[im 10/186  soft-tissue]
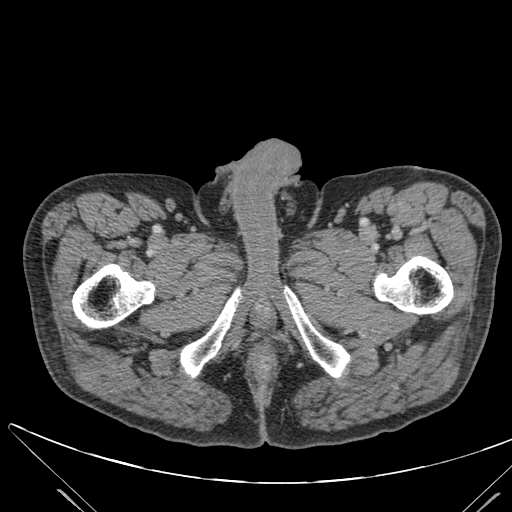
[im 10/186  bone]
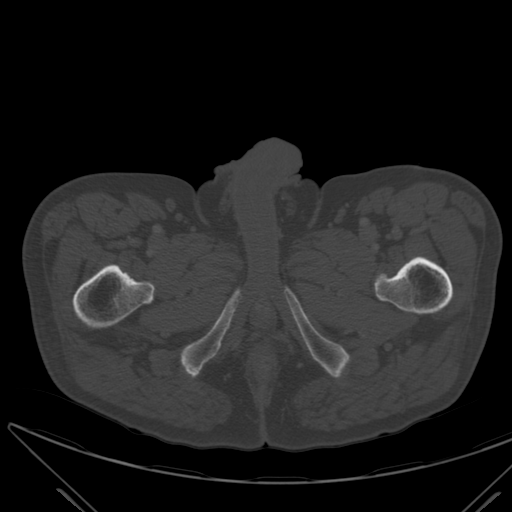
[im 28/186  soft-tissue]
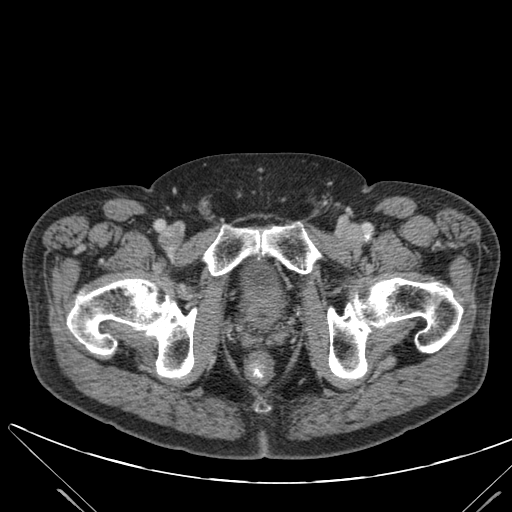
[im 38/186  soft-tissue]
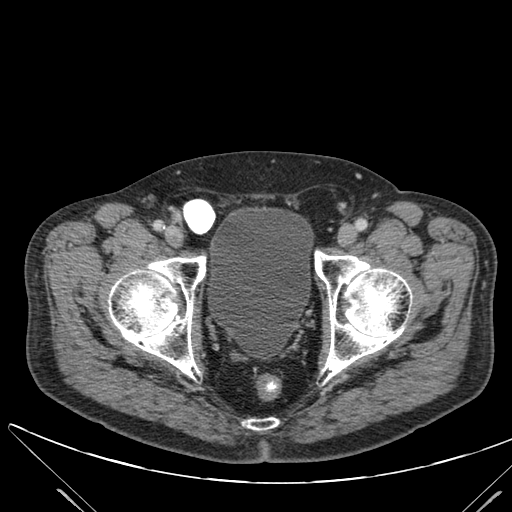
[im 56/186  soft-tissue]
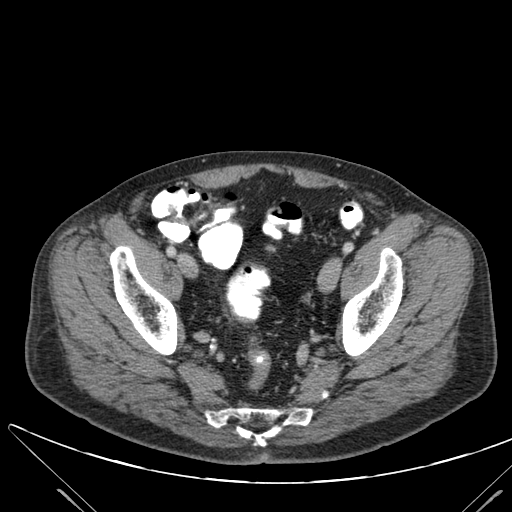
[im 65/186  soft-tissue]
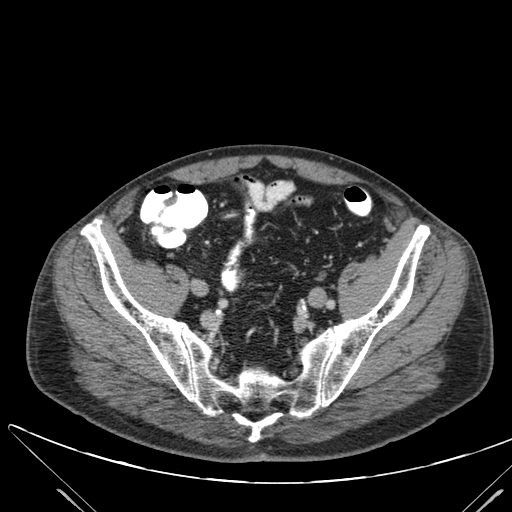
[im 84/186  soft-tissue]
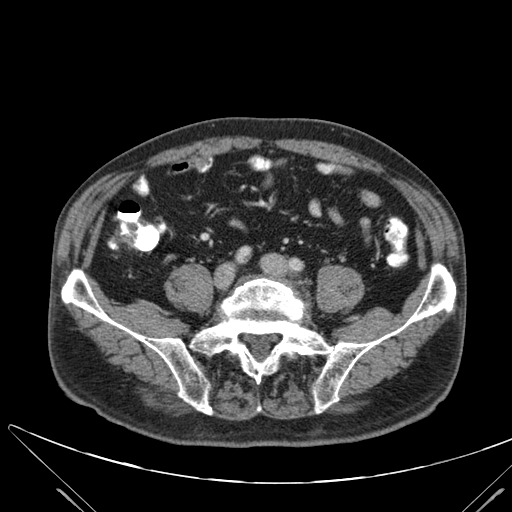
[im 93/186  soft-tissue]
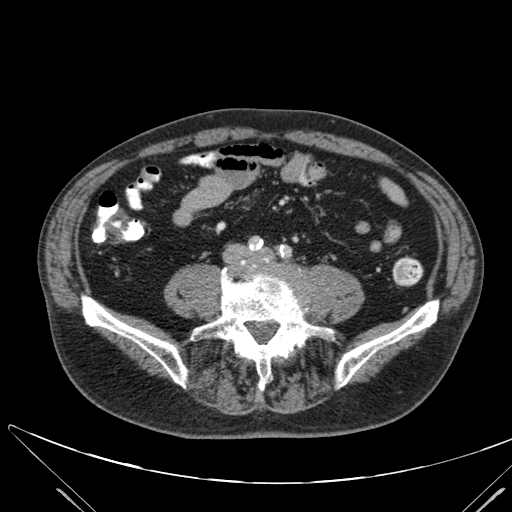
[im 102/186  soft-tissue]
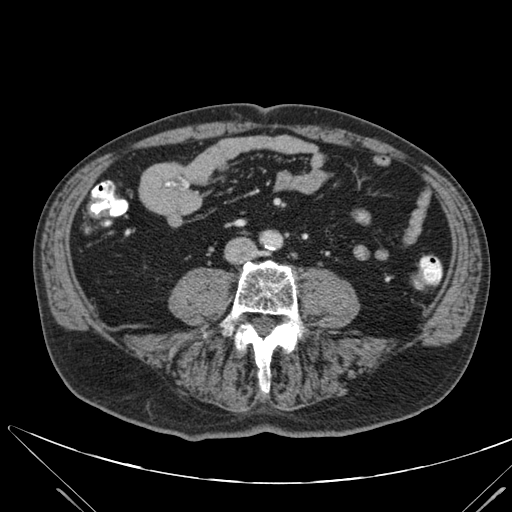
[im 121/186  soft-tissue]
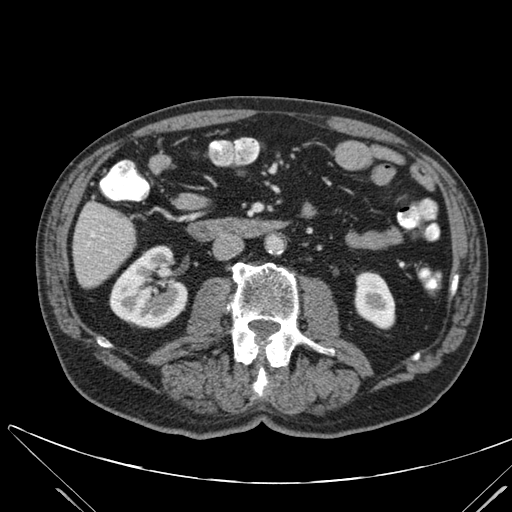
[im 121/186  bone]
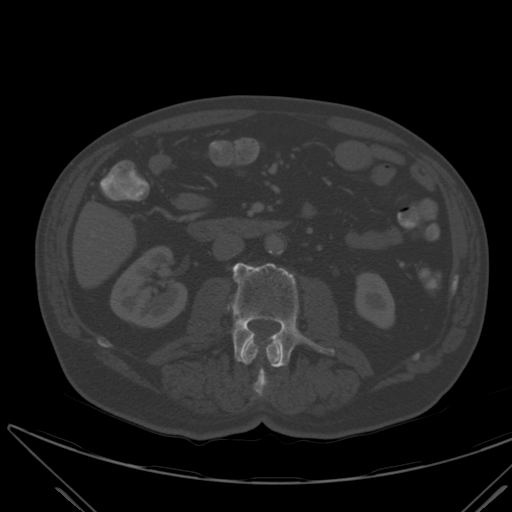
[im 130/186  soft-tissue]
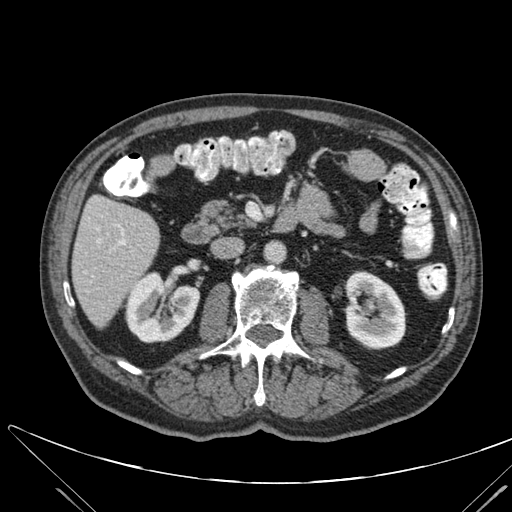
[im 149/186  soft-tissue]
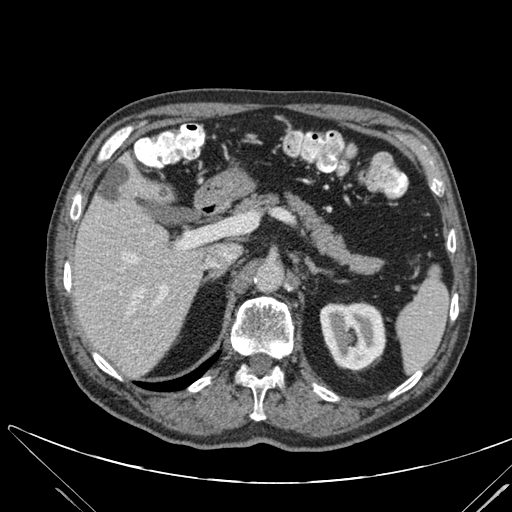
[im 158/186  soft-tissue]
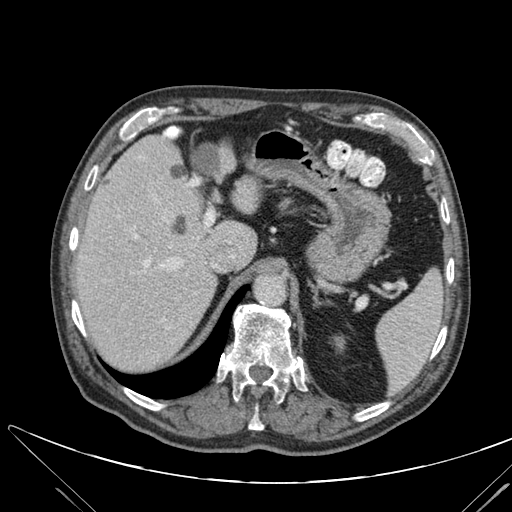
[im 176/186  soft-tissue]
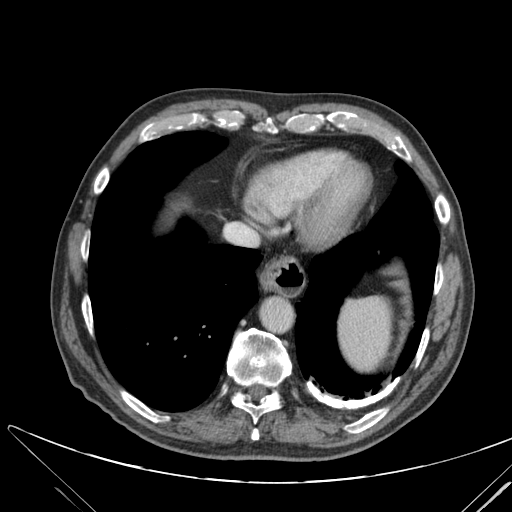

[Series 8087: mpr, coronal, coronal · coronal · 0.90mm/px · 3 of 139 slices shown]
[im 47/139  soft-tissue]
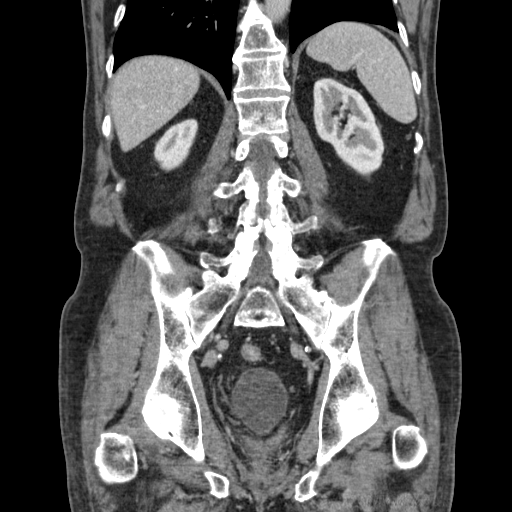
[im 62/139  soft-tissue]
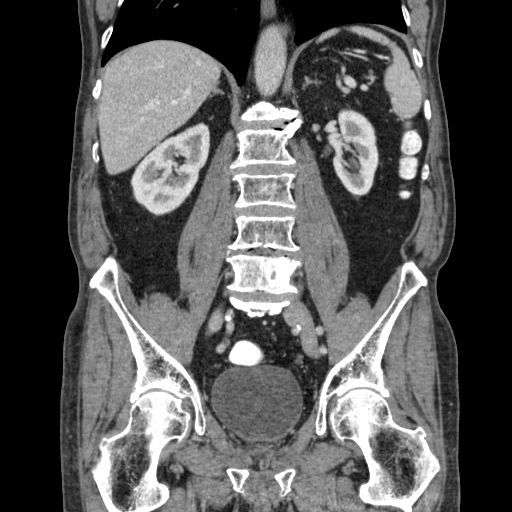
[im 77/139  soft-tissue]
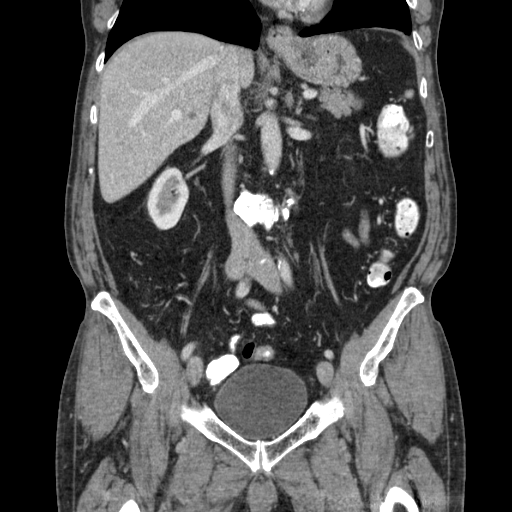

[16 of 46 positions shown; findings below may reference images not displayed]

FINDINGS: Please refer to the separate report for the CT scan of the chest.
There are small calcified gallstones. There is no CT evidence of cholecystitis. The bile ducts appear normal.
The liver is normal in size and overall attenuation. There are numerous cysts and complicated thinly septated cysts scattered in the liver, most in the left lobe. The largest multiseptated cyst measures up to 4.4 cm and 3 Hounsfield units. There is a complicated cyst of slightly increased density in the intersegmental fissure of the left lobe measuring 3.5 x 2.7 cm and 38 Hounsfield units.
There are no solid hepatic lesions. The hepatic and portal veins are patent.
The spleen, pancreas and adrenal glands are normal without masses. There is no evidence of pancreatitis.
The kidneys are normal in size and attenuation. There are no right or left renal masses. There is no evidence of pyelonephritis.
There is a right lower pole renal stone measuring 8 x 6 mm. There are no right or left ureteral stones or hydronephrosis. Both kidneys excrete contrast on delayed imaging.
The urinary bladder appears normal. The seminal vesicles and prostate gland are normal in size. There are 2 prostate metallic density suggesting prior biopsy or radiation seeds.
There is a small paraesophageal hernia measuring up to 3.5 cm. The stomach, small bowel loops and colon demonstrate a nonobstructive bowel gas pattern.
The appendix is not seen. There is mild diffuse wall thickening throughout the rectum but this loop is relatively contracted and this is likely an artifact of incomplete distention. There is no surrounding mesenteric edema or fluid. There is no other significant focal bowel wall thickening, mesenteric edema, ascites, free air or adenopathy. There is aortoiliac vascular calcification but no aneurysm.
Bone windows reveal mixed lytic and sclerotic changes of the L5 vertebral body, suspicious for metastatic disease but nonspecific.
There is multilevel paravertebral ossification in a pattern most consistent with ankylosing spondylitis and/or diffuse idiopathic hyperostosis.
IMPRESSION: 1. Hepatic cysts and complicated cysts.
2. 8 x 6 mm nonobstructing right lower pole renal stone.
3. Small paraesophageal hiatal hernia.
4. Mild apparent wall thickening of the rectum which is likely an artifact of incomplete distention.
5. No visible adenopathy.
6. Ankylosing spondylitis.
All CT scans at this facility use dose modulation, iterative reconstruction, and/or weight-based dosing when appropriate to reduce radiation dose to as low as reasonably achievable.
LOCATION CODE: 1

## 2019-02-13 IMAGING — CT CT GUIDED NEEDLE BIOPS
1 series · 12 of 14 positions shown, 15 images · non-contrast
Comparison: CT scan 3030

CT GUIDED NEEDLE BIOPSY BONE
INDICATION: 6GA.GD
TECHNIQUE: Patient draped and prepped in a prone position the CT scanner. IV conscious sedation provided by radiology nursing supervision radiologist for total 40 minutes including monitoring of O2 sats saturation heart rate and blood pressure.
Total DLP 243

[Series 3: bx · axial · 0.70mm/px · z∈[-71,-63]mm · 12 of 24 slices shown, 15 images]
[im 2/24  soft-tissue]
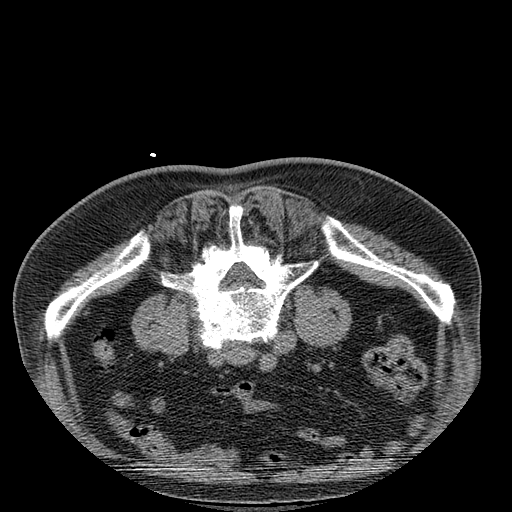
[im 2/24  bone]
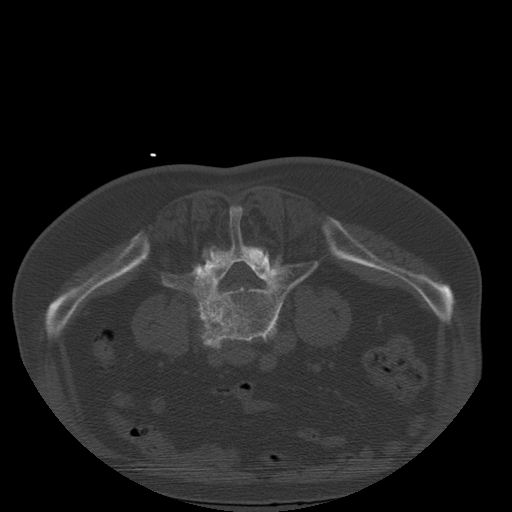
[im 4/24  bone]
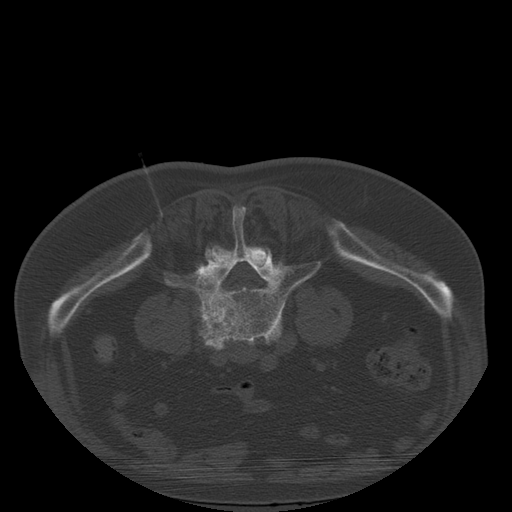
[im 6/24  bone]
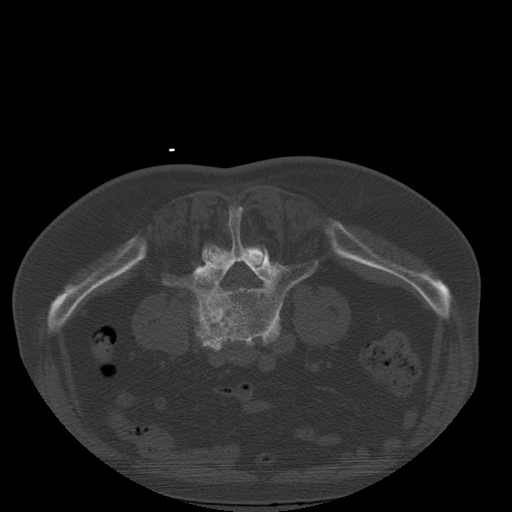
[im 8/24  bone]
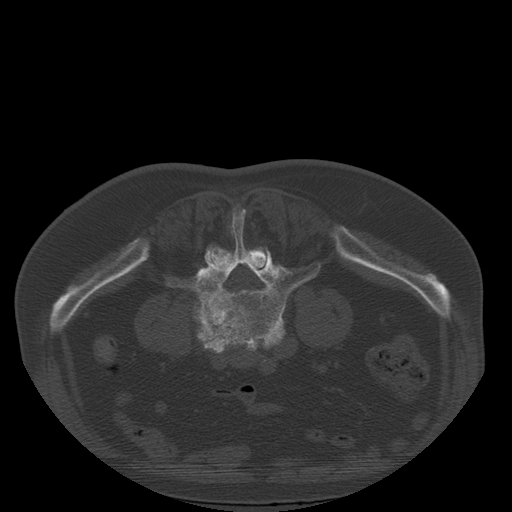
[im 9/24  soft-tissue]
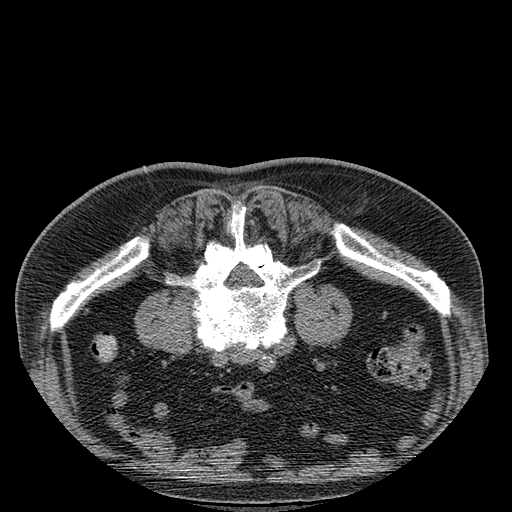
[im 9/24  bone]
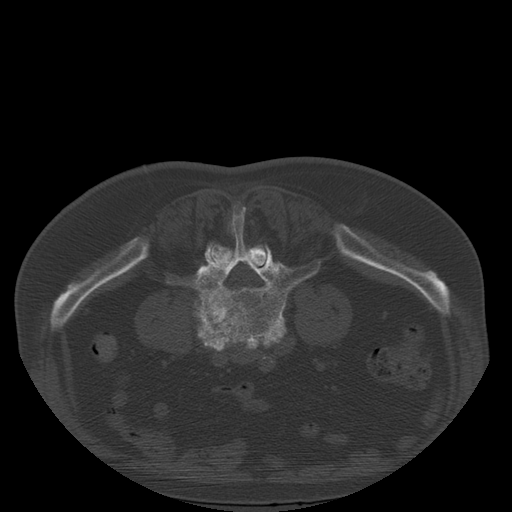
[im 11/24  bone]
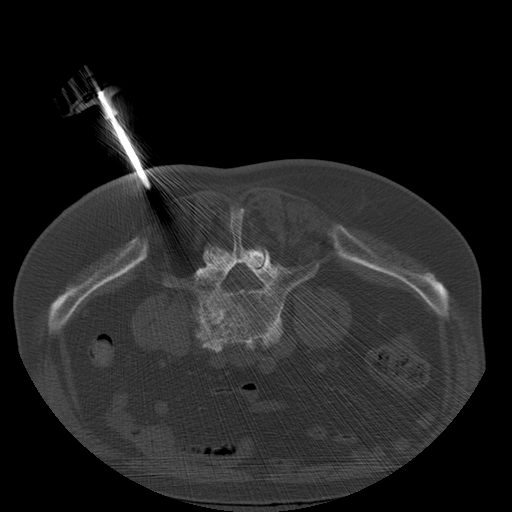
[im 13/24  bone]
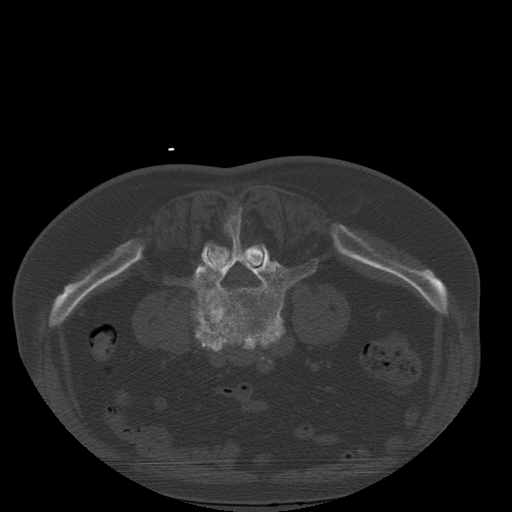
[im 15/24  bone]
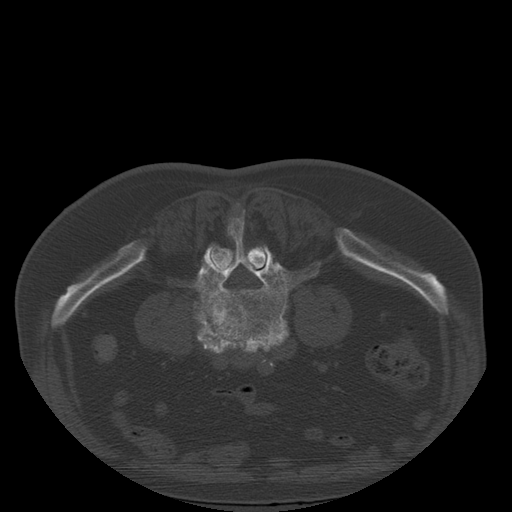
[im 16/24  soft-tissue]
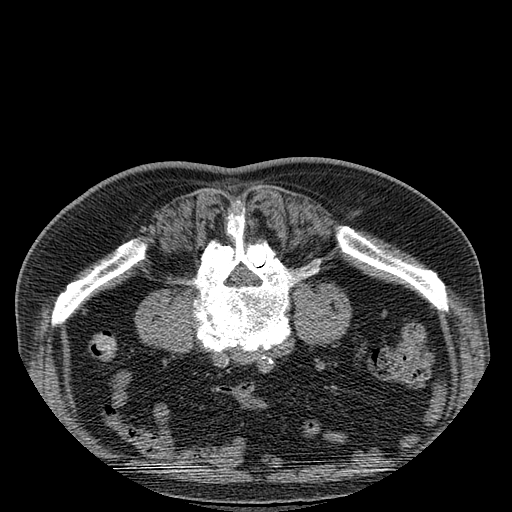
[im 16/24  bone]
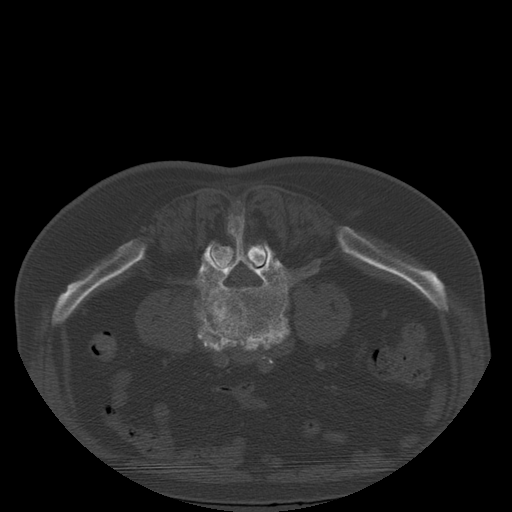
[im 18/24  bone]
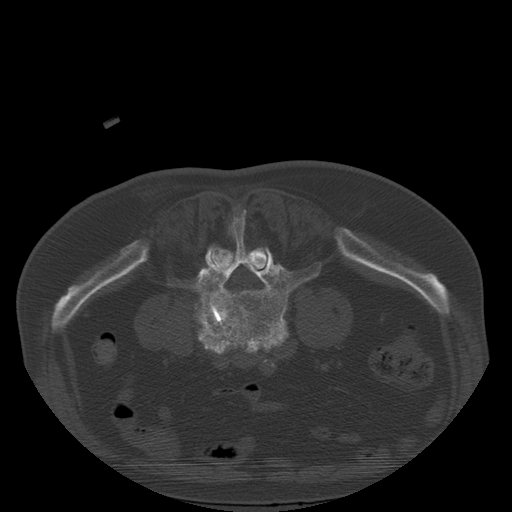
[im 20/24  bone]
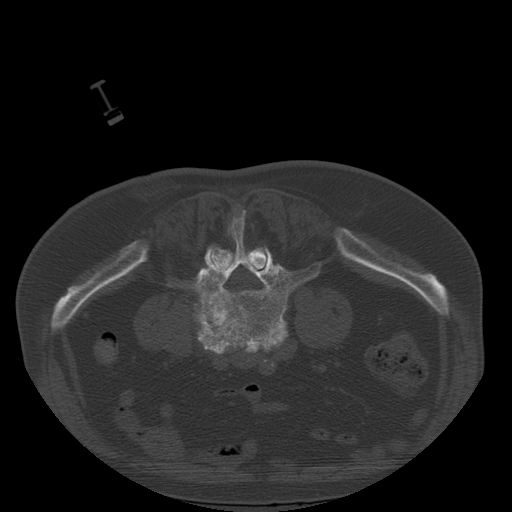
[im 22/24  bone]
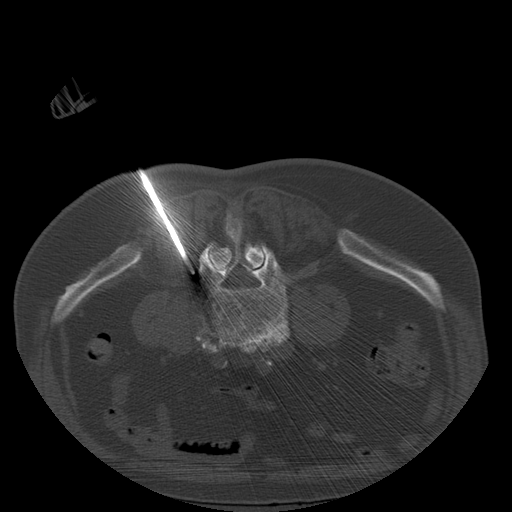

[12 of 14 positions shown; findings below may reference images not displayed]

FINDINGS: Using local Xylocaine and CT guidance and fluoroscopy a Kyphon express needle biopsy was advanced using a left posterior lateral approach into the pedicle and the pedicle body junction of the left side of the body of L5. After adequate needle placement a bone biopsy was used in a coaxial fashion obtaining good cores of tissue. There is no immediate complications.
The needle and sheath were removed and hemostasis maintained with manual compression without immediate complication.
IMPRESSION: 
IMPRESSION: Successful CT-guided biopsy left body pedicle junction lesion L5 without complication.
Location: 1

## 2019-09-07 IMAGING — CT CT CHEST W CONTRAST
2 of 5 series · 15 of 36 positions shown, 18 images · IV contrast (agent unspecified)
Comparison: 01/14/2019.

CT CHEST WITH CONTRAST
INDICATION: C61

[Series 6: lung · axial · 0.74mm/px · z∈[-352,-50]mm · 12 of 144 slices shown, 15 images]
[im 12/144  mediastinal]
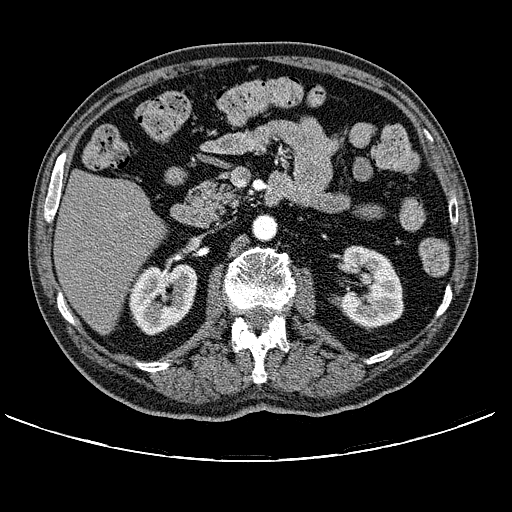
[im 12/144  lung]
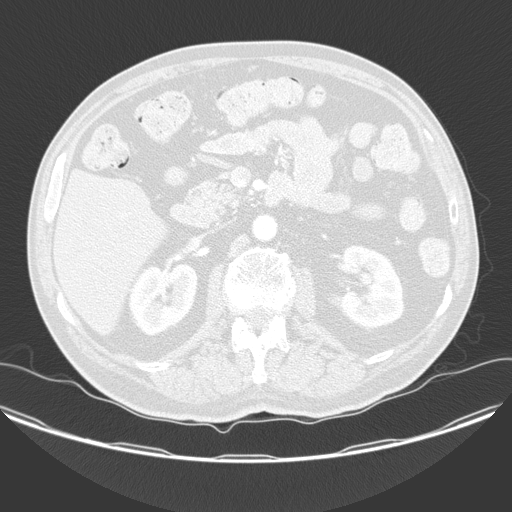
[im 23/144  lung]
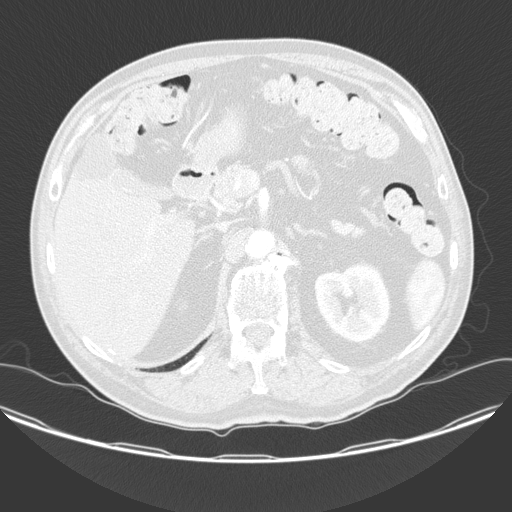
[im 34/144  lung]
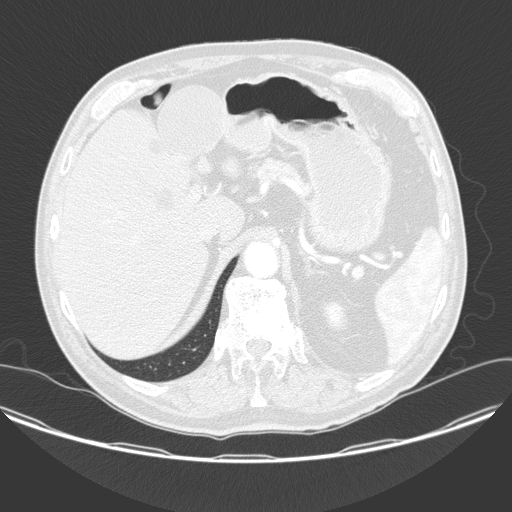
[im 45/144  lung]
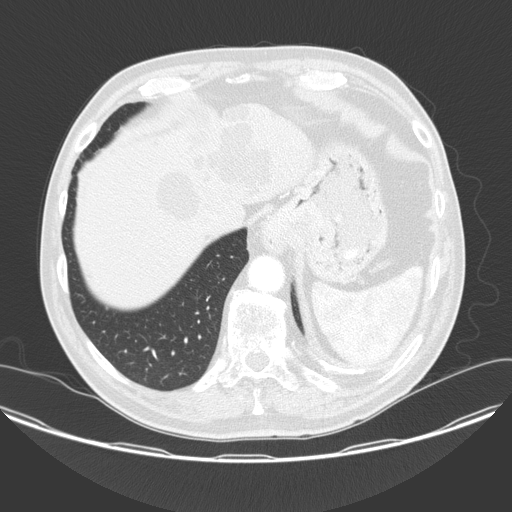
[im 56/144  mediastinal]
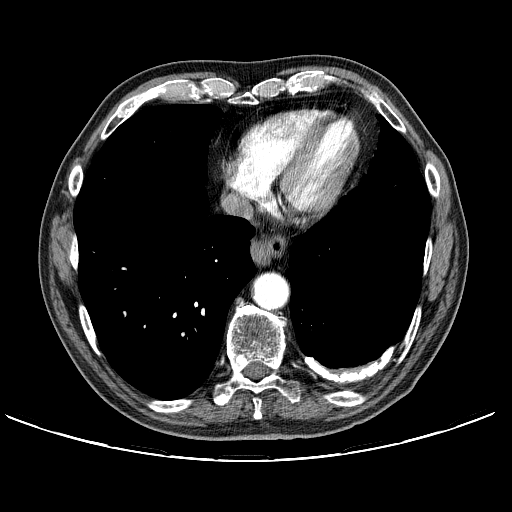
[im 56/144  lung]
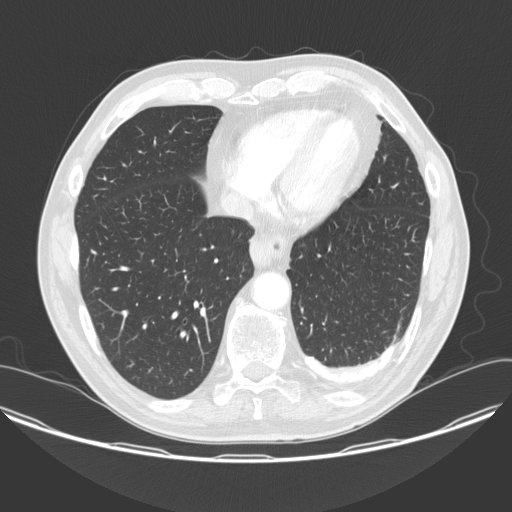
[im 67/144  lung]
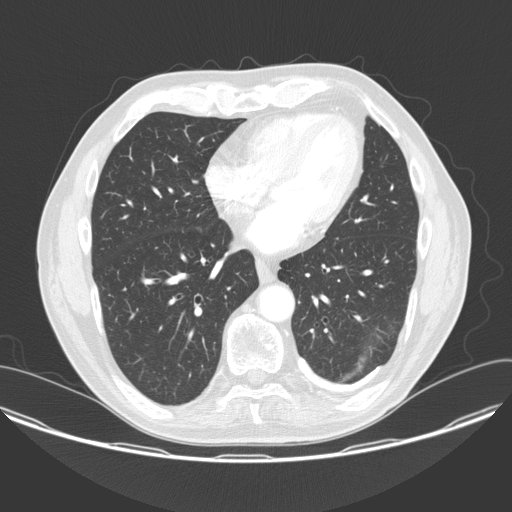
[im 78/144  lung]
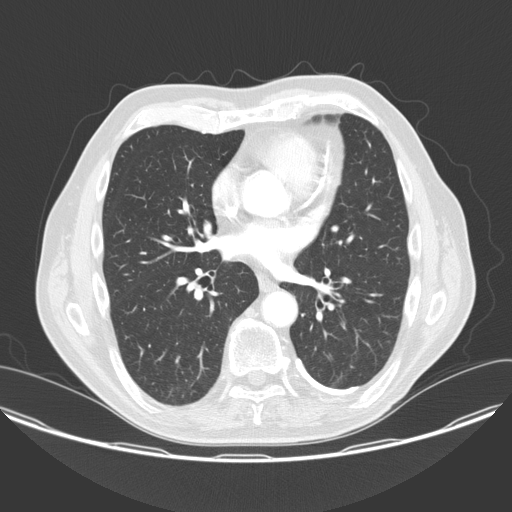
[im 89/144  lung]
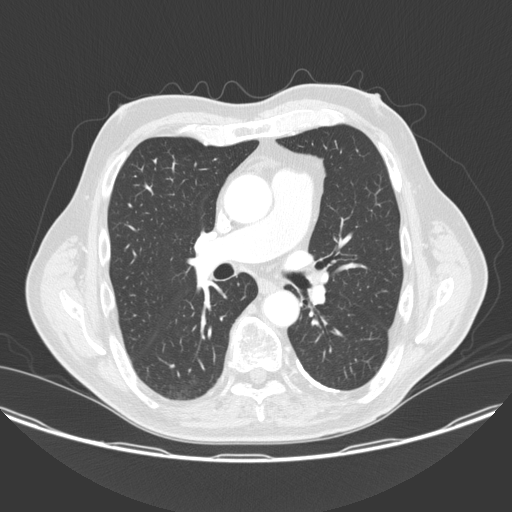
[im 100/144  mediastinal]
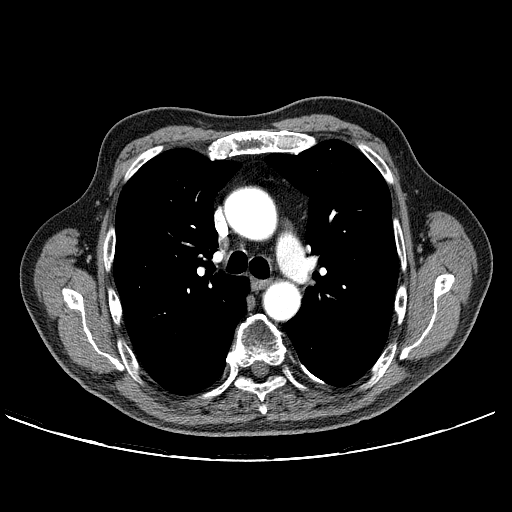
[im 100/144  lung]
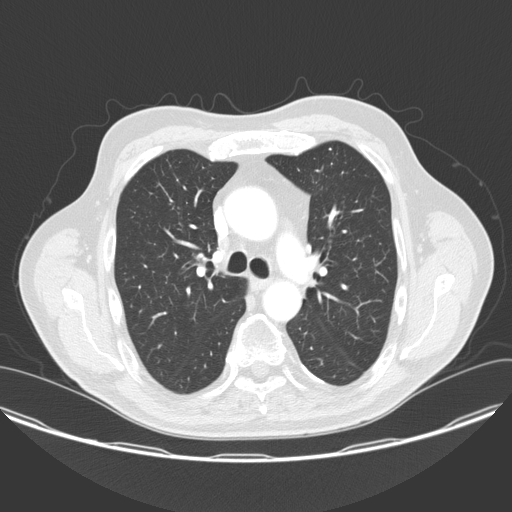
[im 111/144  lung]
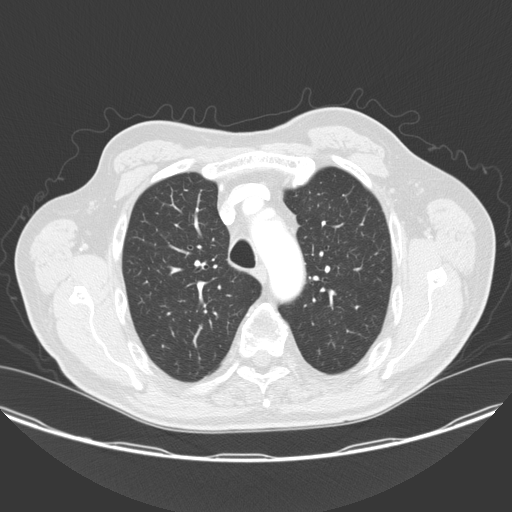
[im 122/144  lung]
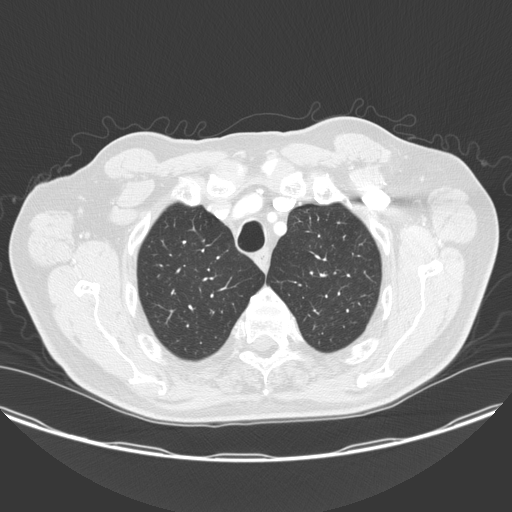
[im 133/144  lung]
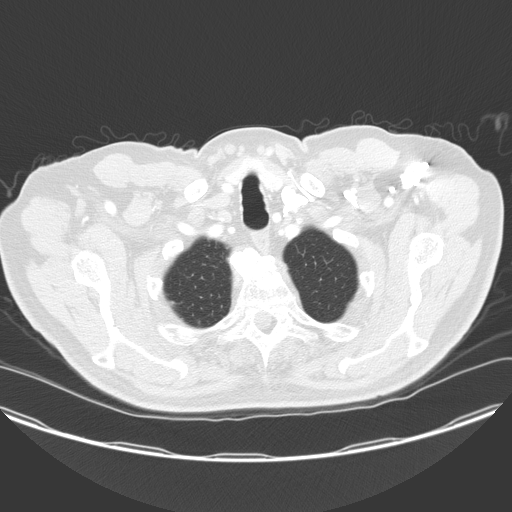

[mpr, cor, coronal · coronal · 0.74mm/px · 3 of 151 slices shown]
[im 31/151  lung]
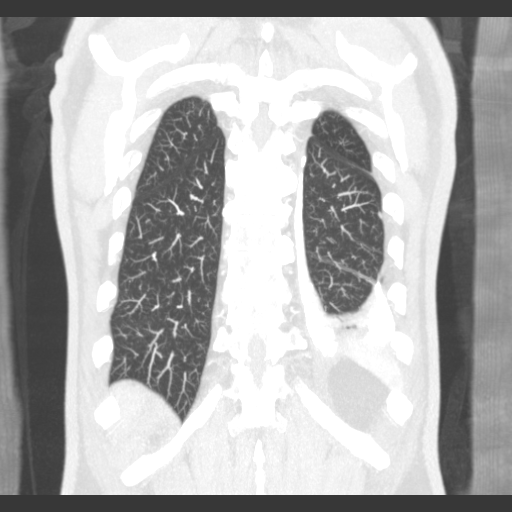
[im 61/151  lung]
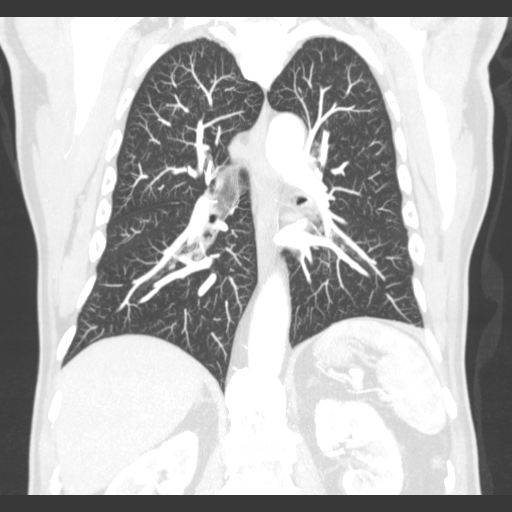
[im 91/151  lung]
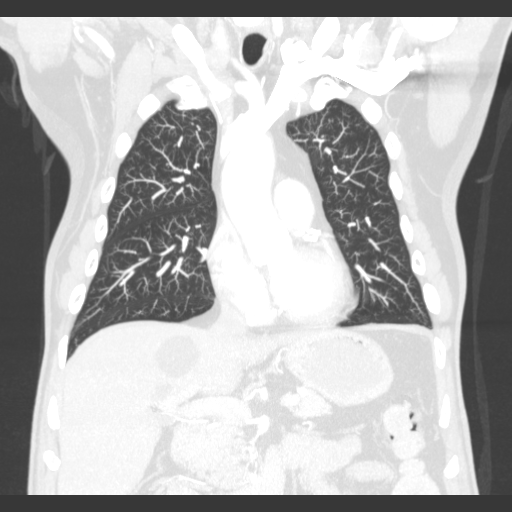

[15 of 36 positions shown; findings below may reference images not displayed]

PROCEDURE:
Axial CT images of the chest were obtained following administration IV contrast. Informed consent for contrast was obtained. Coronal and sagittal reconstruction images were generated and reviewed.
FINDINGS: Bilateral lungs are clear the exception of posterior left pleural calcification and minimal adjacent atelectasis. Findings are stable. No pleural effusions or pneumothorax. Patent central airways. The heart and great vessels normal size without pericardial effusion or filling defect. No evidence for thoracic lymphadenopathy.
The visualized upper abdominal structures show numerous well-circumscribed hepatic hypodensities measuring up to 5.1 cm likely representing cysts and are unchanged. Small sliding hiatal hernia.
The bones are osteopenic with diffuse thoracolumbar ankylosing spondylosis. Interval increase conspicuity of numerous sclerotic abnormalities throughout the thoracolumbar spine, sternum, multiple bilateral ribs and bilateral scapula in keeping with metastatic bone disease. Difficult to determine if this represents true progression of disease or simply pre-existing disease which was noted on 01/16/2019 nuclear medicine bone scan, however a call by CT (there tends to be a significant delay between it show disease and when visible on CT scan).
IMPRESSION: Increased conspicuity of metastatic bone disease, which appears to be grossly stable from nuclear medicine bone scan performed 01/16/2019. Findings are likely the delayed radiographic manifestations of pre-existing disease.
No evidence for metastatic disease within the chest.
Location code: 1

## 2020-03-02 IMAGING — CT CT ABDOMEN PELVIS W CONTRAST
2 of 4 series · 10 of 46 positions shown, 11 images · IV contrast (Iodine)
Comparison: none

CT OF THE ABDOMEN WITH CONTRAST:
Location number is 4
CLINICAL INDICATION:
REFERENCE:  01/14/2019 exam.
TECHNIQUE: Informed consent was obtained for the administration of contrast.  Multiple axial images were obtained from the lung bases through the upper pelvis following the uneventful administration of   _ IV contrast.

[Series 501: abd pel, idose (2) · axial · 0.84mm/px · z∈[-589,-229]mm · 7 of 180 slices shown, 8 images]
[im 18/180  soft-tissue]
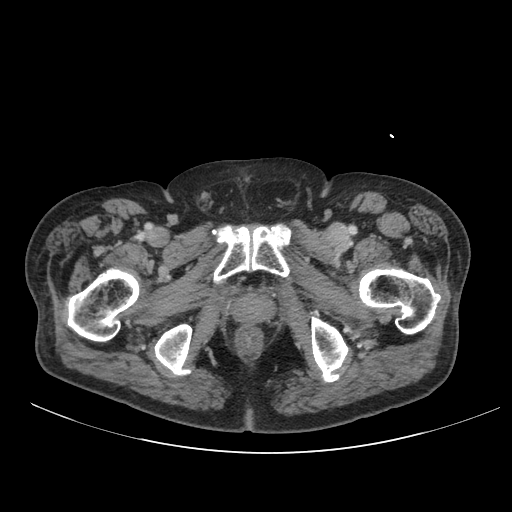
[im 18/180  bone]
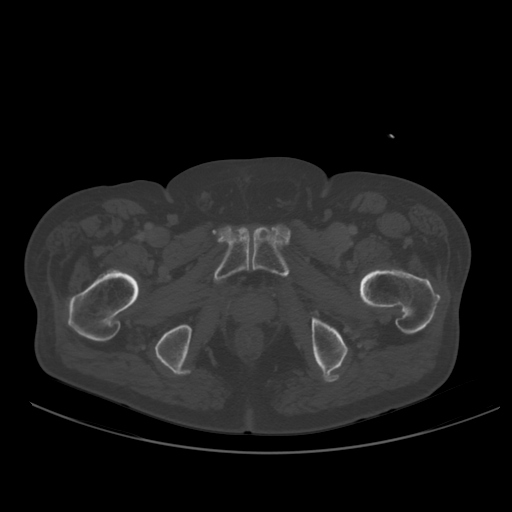
[im 45/180  soft-tissue]
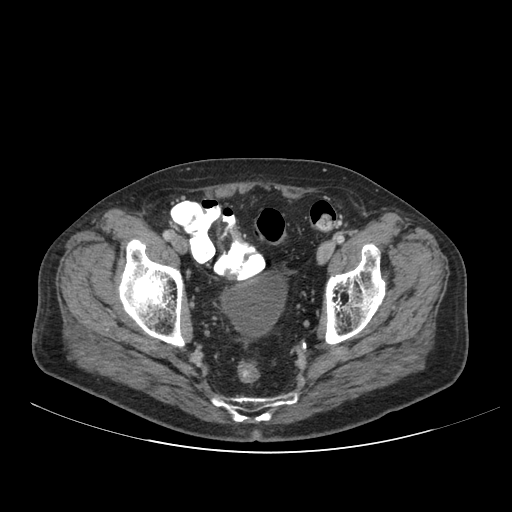
[im 63/180  soft-tissue]
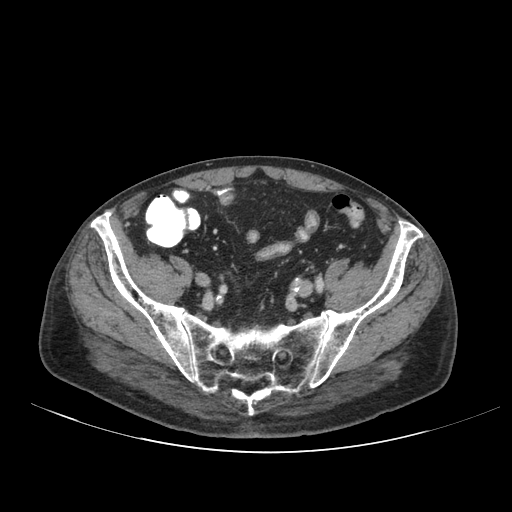
[im 90/180  soft-tissue]
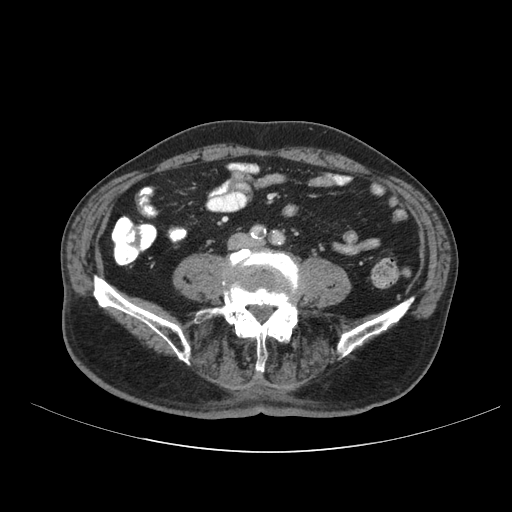
[im 117/180  soft-tissue]
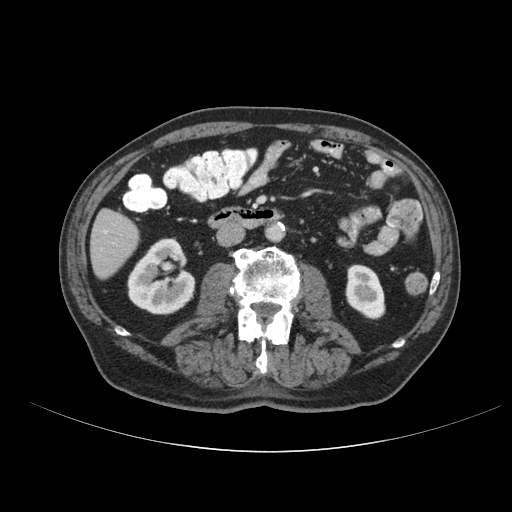
[im 135/180  soft-tissue]
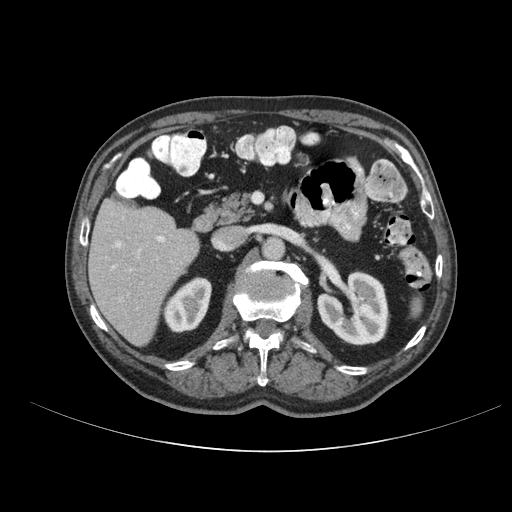
[im 162/180  soft-tissue]
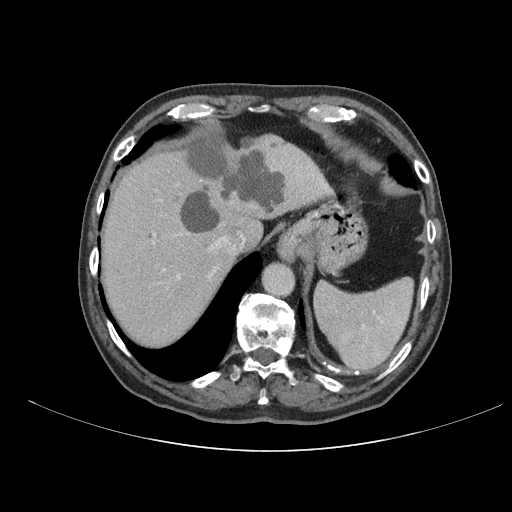

[Series 503: coronal, idose (2) · coronal · 0.45mm/px · 3 of 159 slices shown]
[im 53/159  soft-tissue]
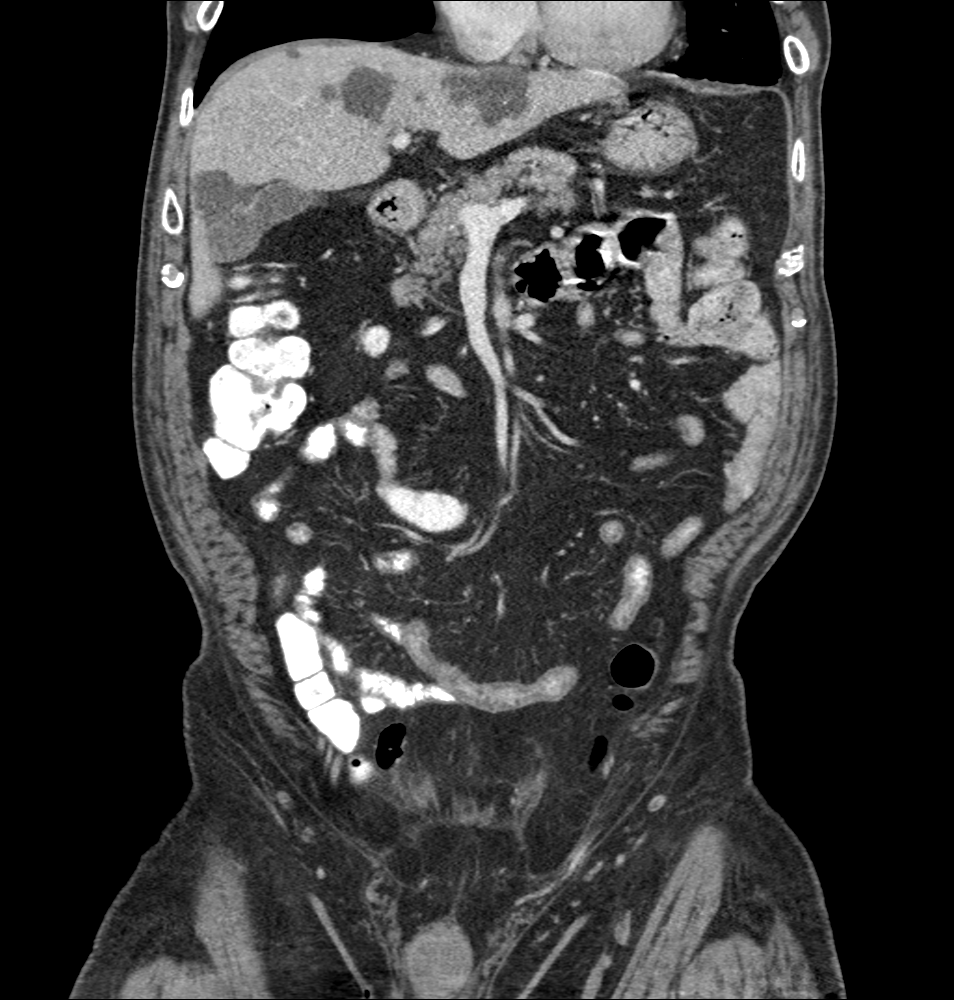
[im 71/159  soft-tissue]
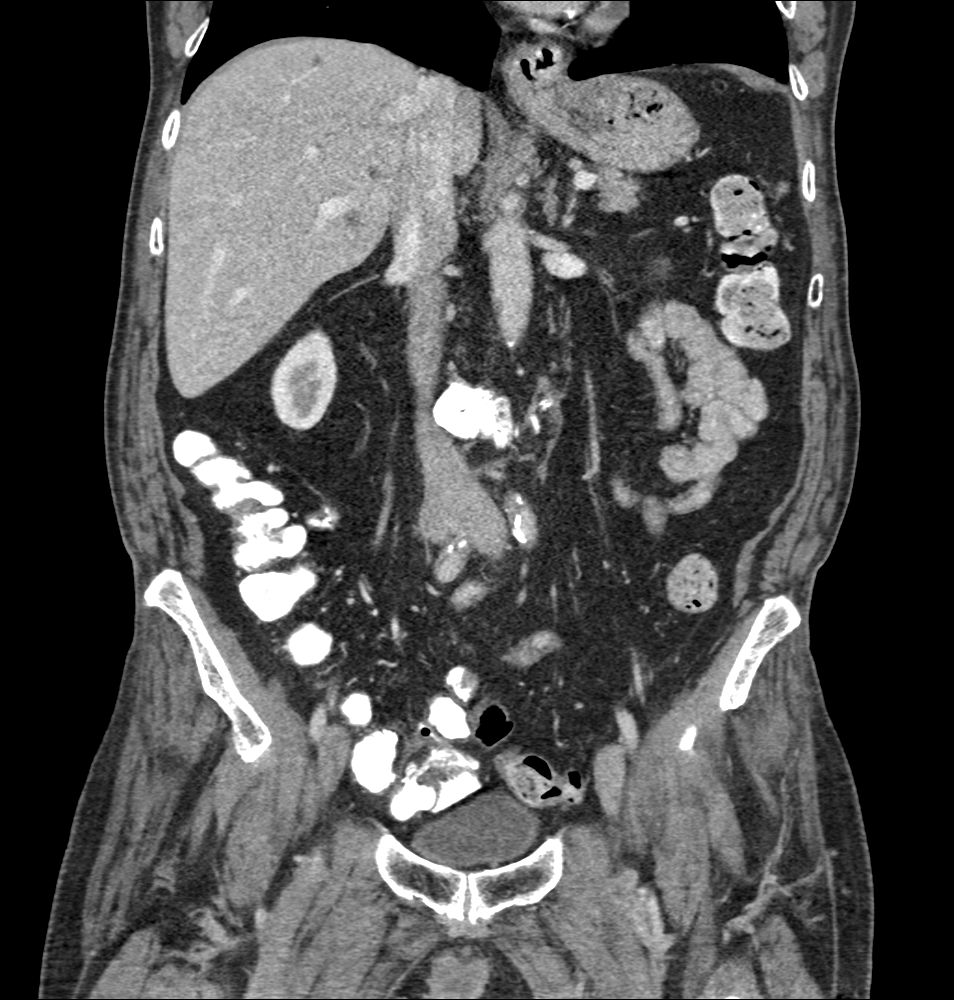
[im 88/159  soft-tissue]
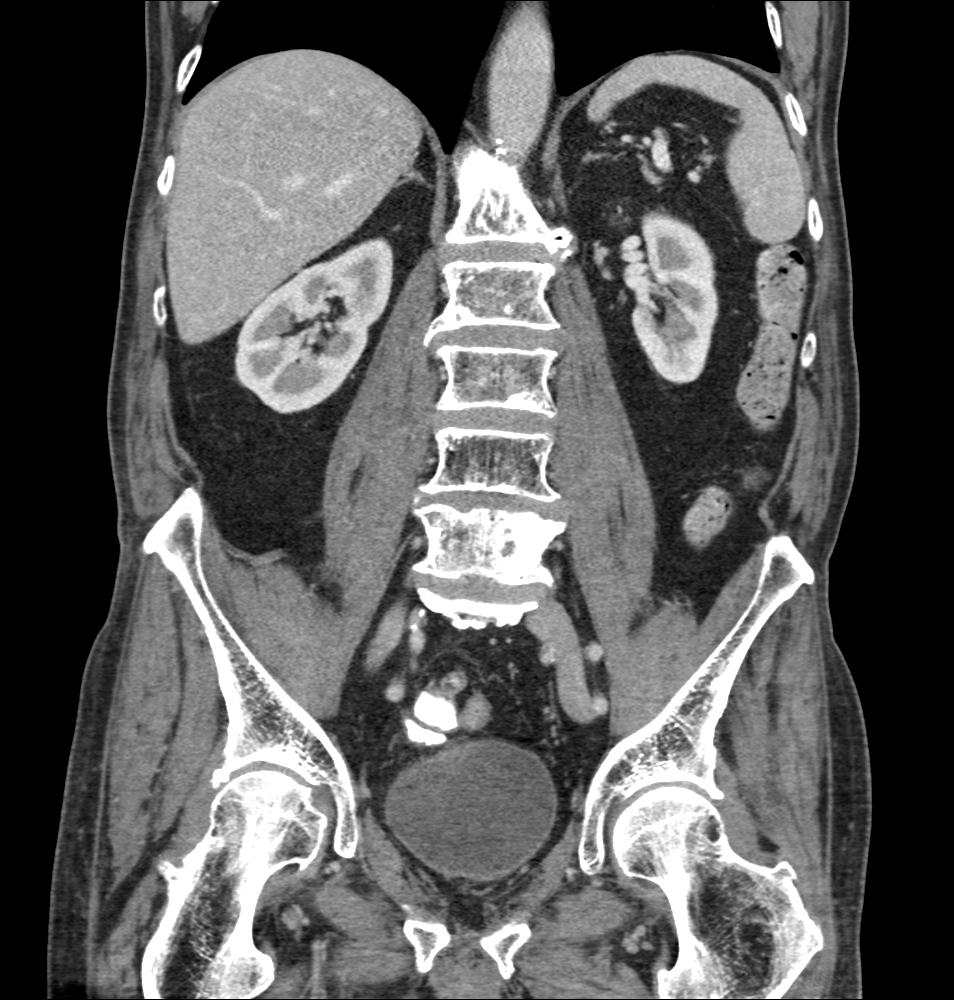

[10 of 46 positions shown; findings below may reference images not displayed]

FINDINGS: The liver shows multiple foci of hypodensity keeping with cysts which are also demonstrated on prior examination the largest cyst measures 46 mm x 52 mm greatest axial dimensions which is increased in size from prior exam. This measures 15 units in density plus and -12 units with decreased attenuation in with more simple appearance than on prior examination. There is small hiatal hernia.
The spleen, pancreas appear unremarkable. There is some foci of increased density dependently within the gallbladder compatible stones. There is no gallbladder wall thickening or adjacent fluid.
Adrenal glands appear unremarkable.
Lower pole right kidney shows stone measuring 2 x 7 mm. There are no ureteral stones and there is no hydronephrosis. Large and small bowel loops are normal in caliber. There is no pathologic intra-abdominal lymph node enlargement. Calcification arteries is present. Abdominal wall is intact. The ureter bladder appears unremarkable. Pelvic portions of bowel appear unremarkable. Bones show multiple circumscribed foci of increased density involving vertebral bodies, subcentimeter foci. These most likely represent sequelae of patient having had metastatic disease given the chest CT findings and given history. There is some bridging osteophytes demonstrated lower thoracic level through L1 level with the disc space heights maintained at these levels compatible with ankylosing spondylitis. Partial fusion is demonstrated right left SI joints.
IMPRESSION: There are new foci prior examination of subcentimeter bone sclerosis involving L1-L2 and L3 vertebral bodies compatible with osseous metastatic disease
No intra-abdominal metastatic disease.
Multiple hepatic cysts. There is some increase in size of cyst from prior exam although there is decreased complexity factor against aggressive process.

## 2020-03-02 IMAGING — CT CT CHEST W CONTRAST
2 of 3 series · 13 of 36 positions shown, 16 images · IV contrast (Iodine)
Comparison: none

EXAM: CT OF THE CHEST WITH CONTRAST:
Location number is 1
Medical indication:T1E.EP
Reference:Chest CT from 09/07/2019.
TECHNIQUE: Informed consent was obtained for the administration of contrast.  Multiple axial images were obtained through the chest following the uneventful administration of intravenous contrast.
Images are obtained not using a pulmonary arteriogram protocol.

[Series 401: chest w, idose (2) · axial · 0.68mm/px · z∈[-294,-1]mm · 10 of 139 slices shown, 13 images]
[im 11/139  mediastinal]
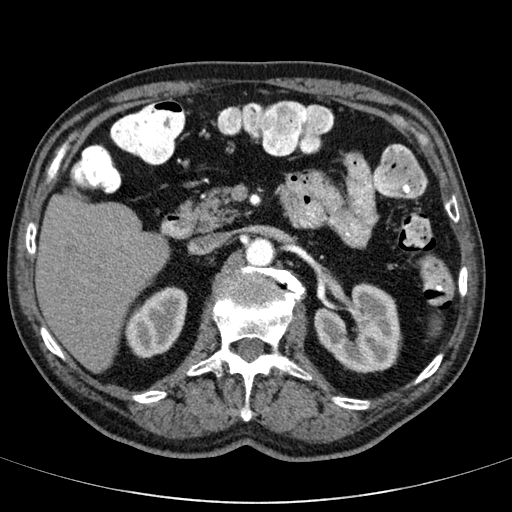
[im 11/139  lung]
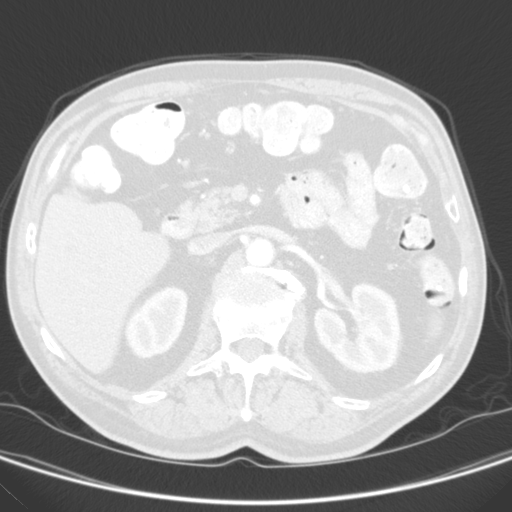
[im 21/139  lung]
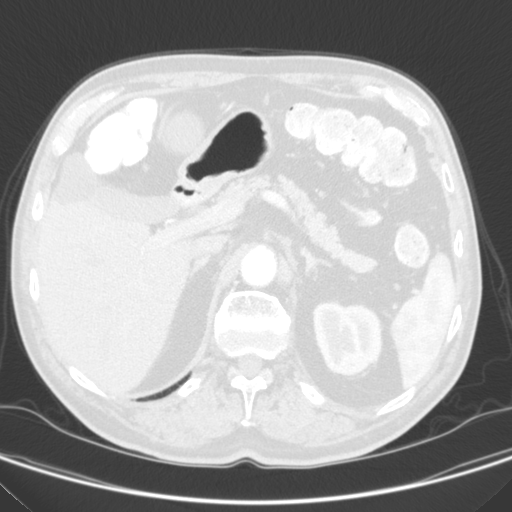
[im 36/139  lung]
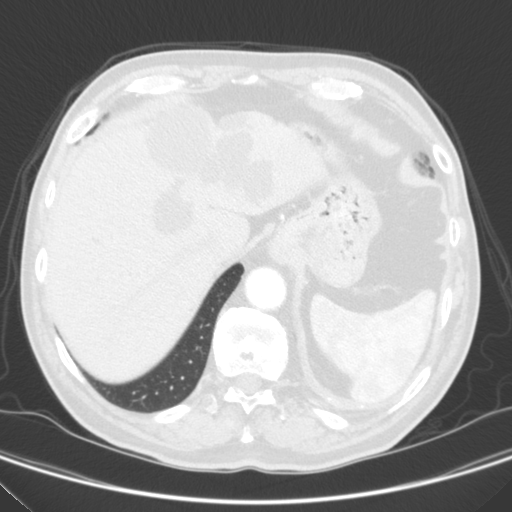
[im 52/139  lung]
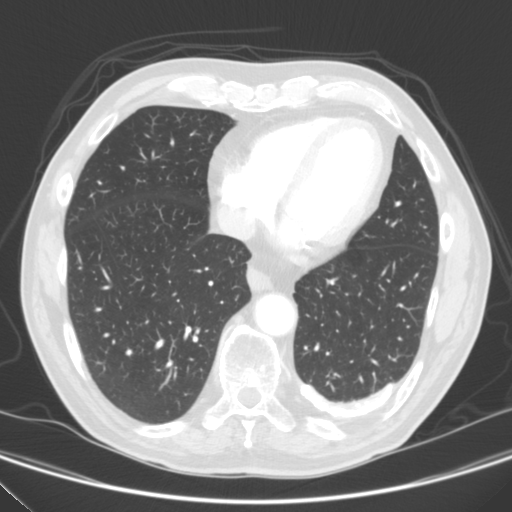
[im 62/139  mediastinal]
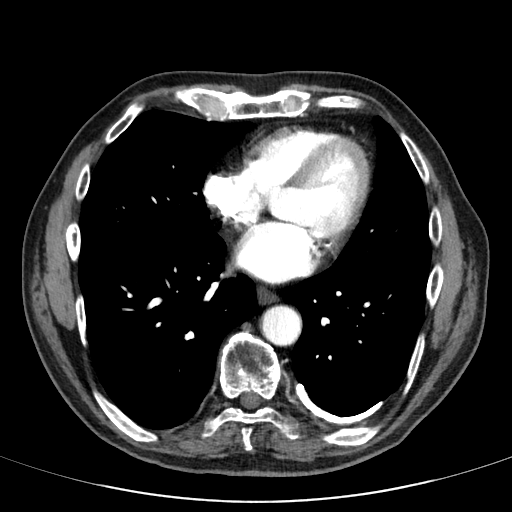
[im 62/139  lung]
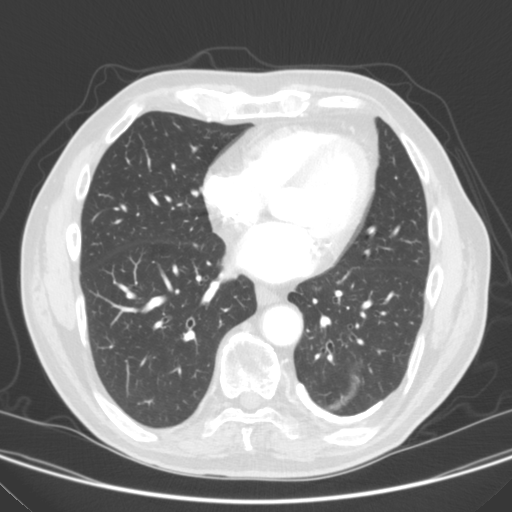
[im 77/139  lung]
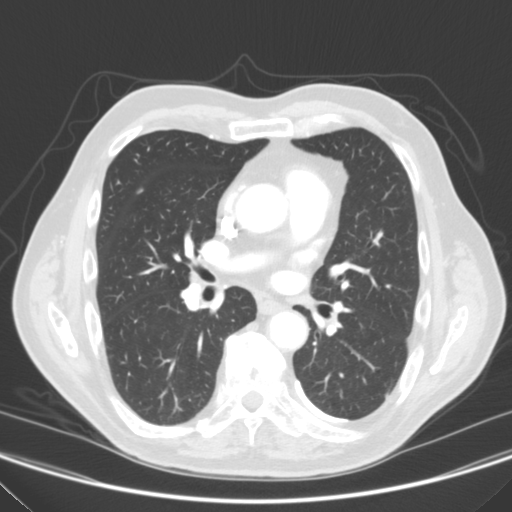
[im 87/139  lung]
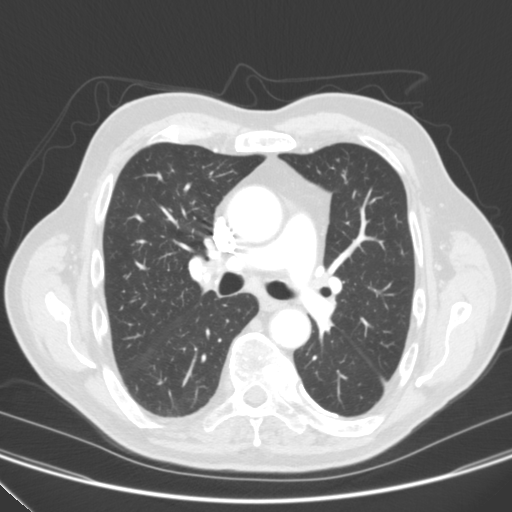
[im 103/139  lung]
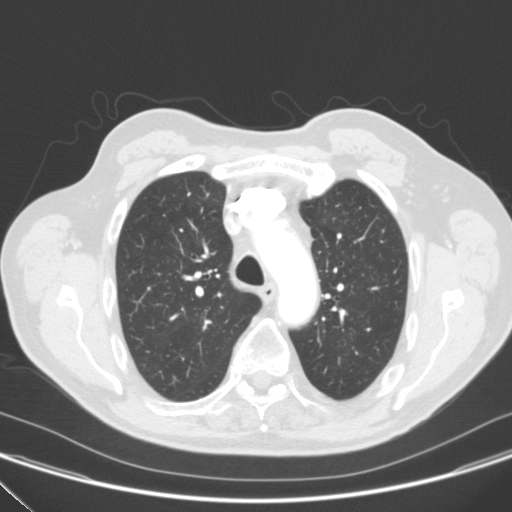
[im 118/139  mediastinal]
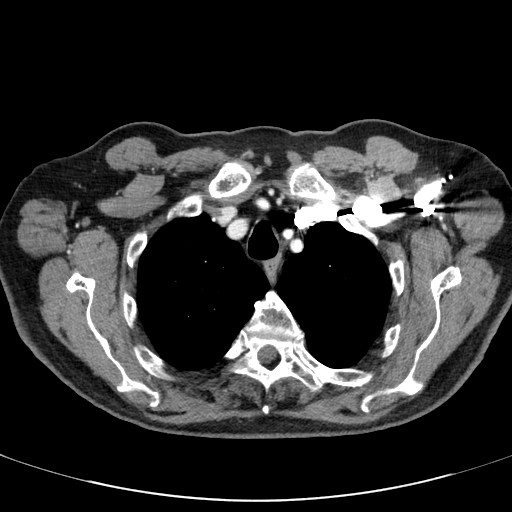
[im 118/139  lung]
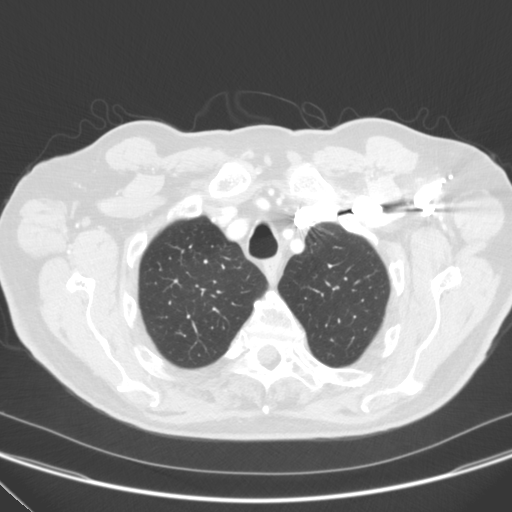
[im 128/139  lung]
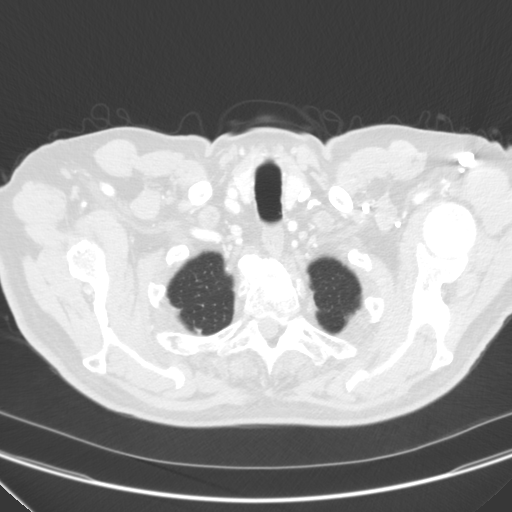

[Series 404: coronal, idose (2) · coronal · 0.45mm/px · 3 of 77 slices shown]
[im 16/77  lung]
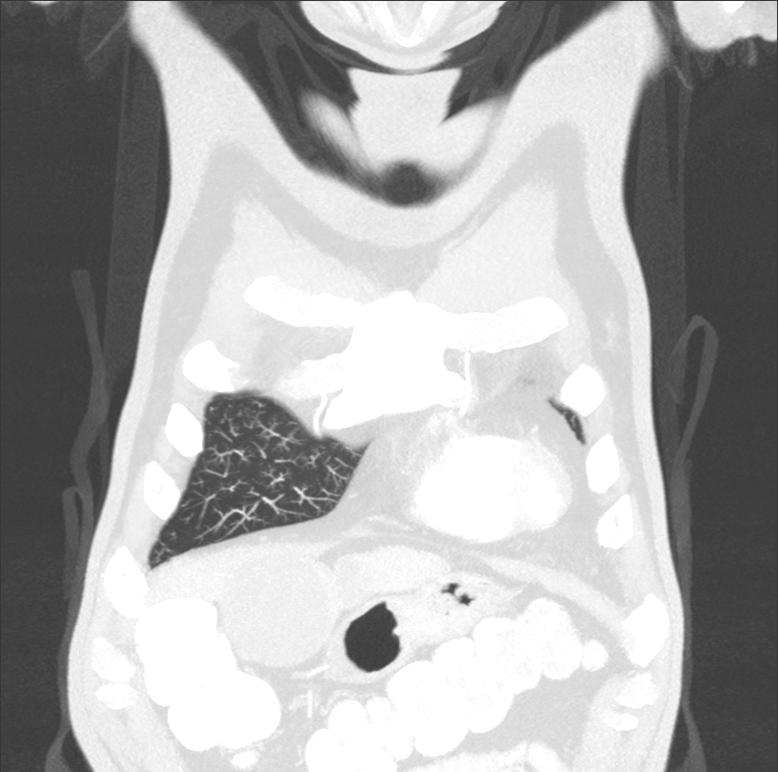
[im 31/77  lung]
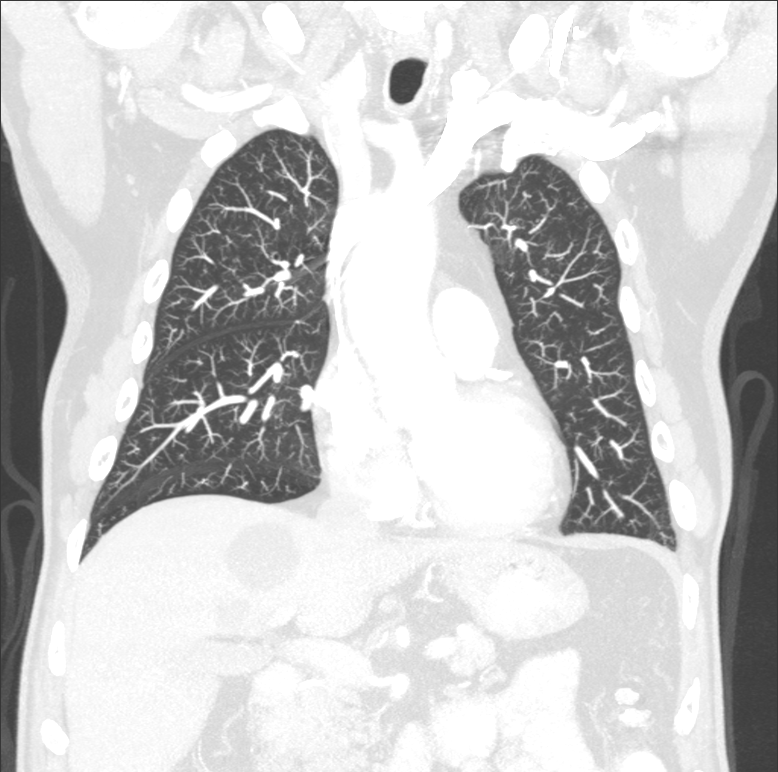
[im 46/77  lung]
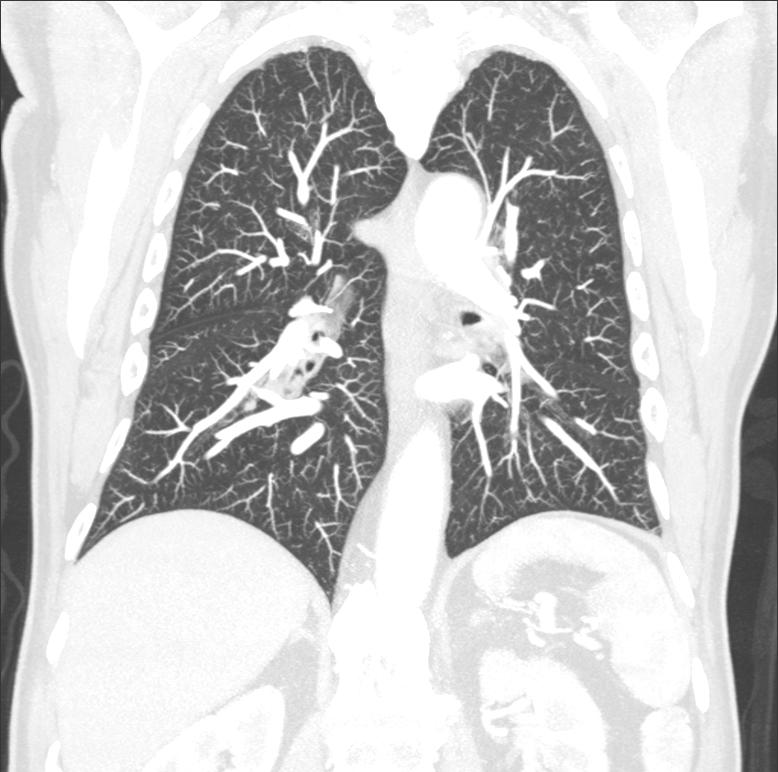

[13 of 36 positions shown; findings below may reference images not displayed]

FINDINGS: Lungs are clear focal areas of increased, nodular, opacity. There is no pleural effusion.
There is some partially calcified pleural plaque demonstrated posterior aspect left hemithorax.
Bones show multiple focal areas of increased density compatible with blastic lesions compatible with metastatic disease in patient with history of prostate cancer. These are also demonstrated on prior examination.
Heart size is normal. There is no pathologic mediastinal or axillary lymph node enlargement.
Upper abdominal viscera show multiple foci of hypodensity in keeping with cysts involving the liver. These are also demonstrated on prior study.
IMPRESSION: No intrathoracic metastatic disease.
Multiple bone lesions compatible with metastatic disease which are also demonstrated on prior exam.

## 2021-03-20 IMAGING — CT CT CHEST WITH CONTRAST
4 of 8 series · 14 of 32 positions shown, 19 images · IV contrast (100)
Comparison: 03/02/2020
01/14/2019
PROCEDURE:
Axial CT images of the chest were obtained following administration IV contrast.

CT CHEST WITH CONTRAST
INDICATION: Malignant neoplasm of prostate
Secondary malignant neoplasm of bone

[Series 3: abdomen/pelvis · axial · 0.88mm/px · z∈[-540,-245]mm · 4 of 198 slices shown, 9 images]
[im 40/198  soft-tissue]
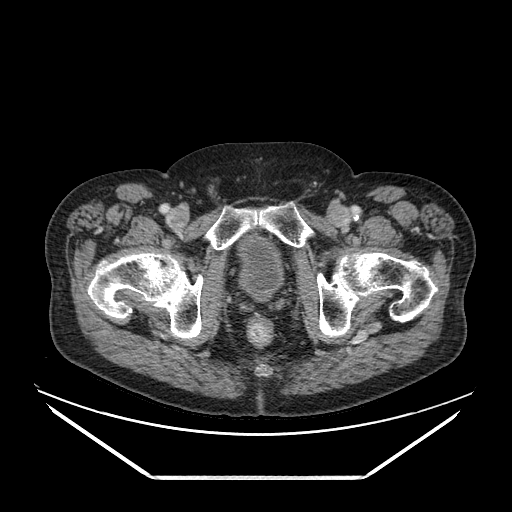
[im 40/198  lung]
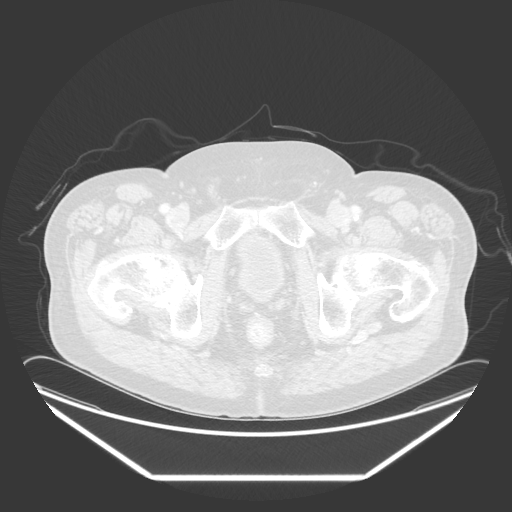
[im 40/198  bone]
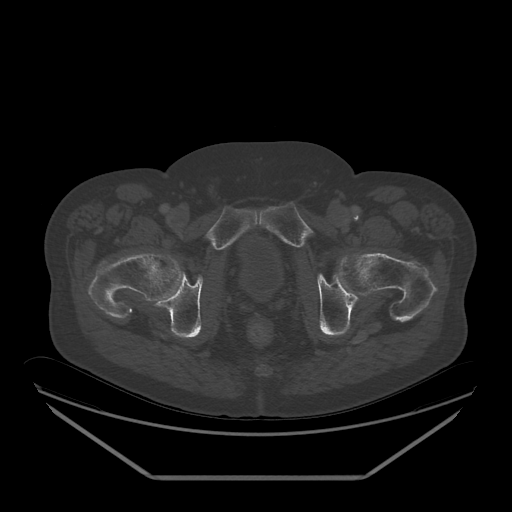
[im 79/198  soft-tissue]
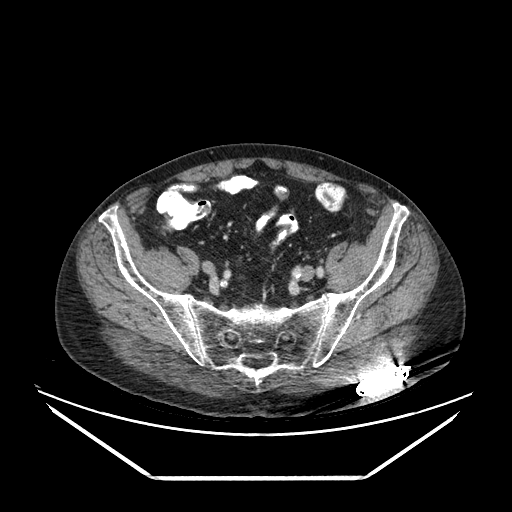
[im 79/198  lung]
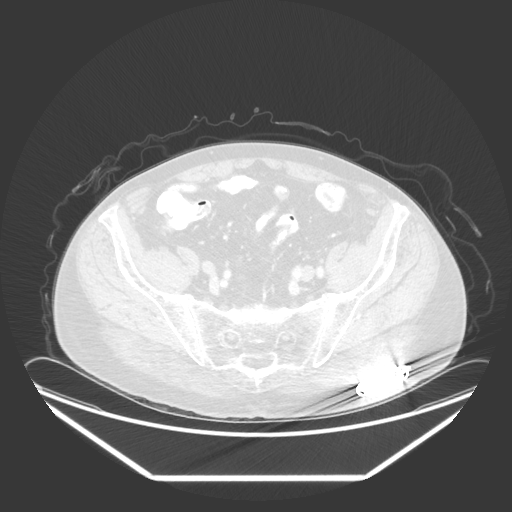
[im 119/198  soft-tissue]
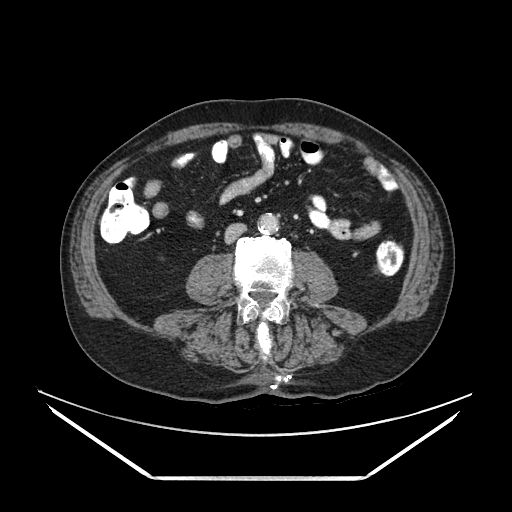
[im 119/198  lung]
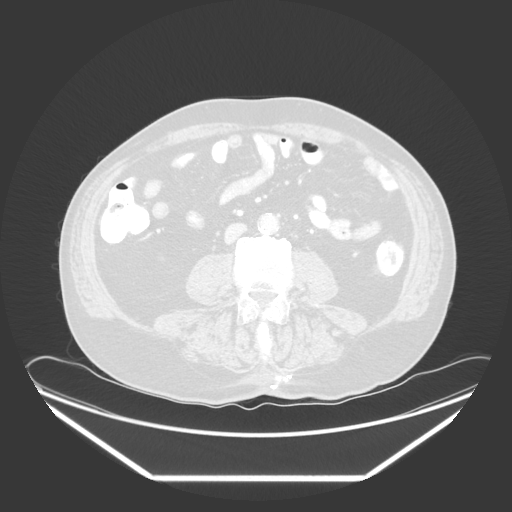
[im 158/198  soft-tissue]
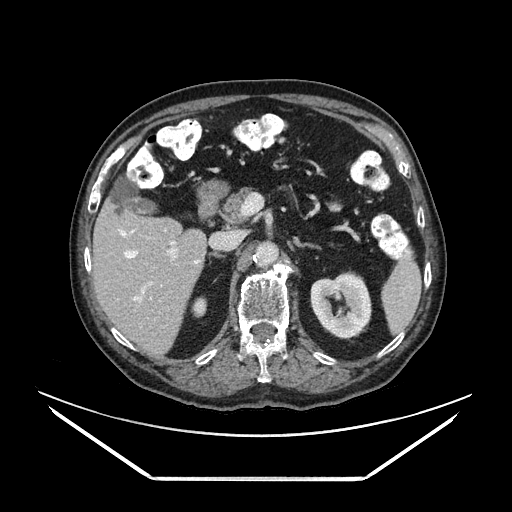
[im 158/198  lung]
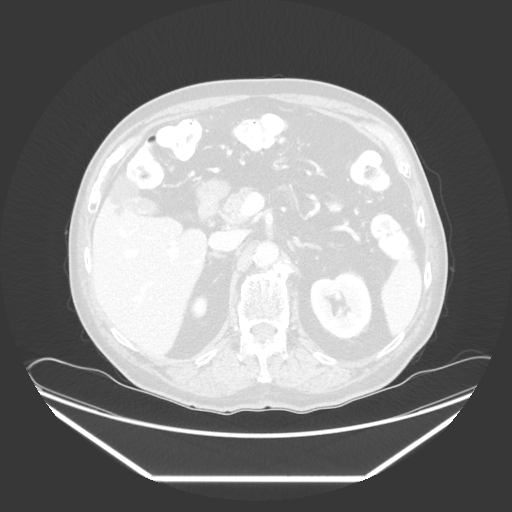

[Series 602: sagittal · sagittal · 0.70mm/px · 3 of 181 slices shown (1 of 2)]
[im 46/181  soft-tissue]
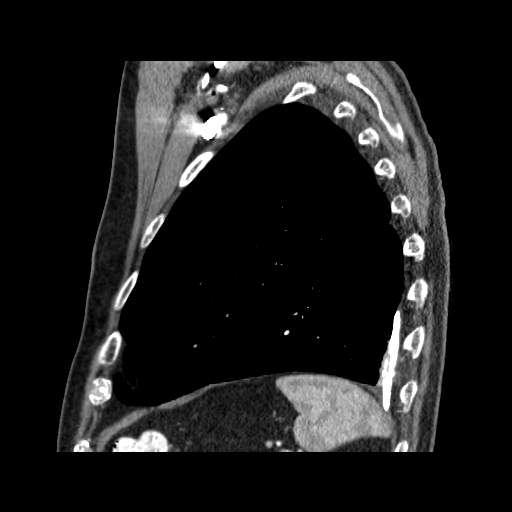
[im 91/181  soft-tissue]
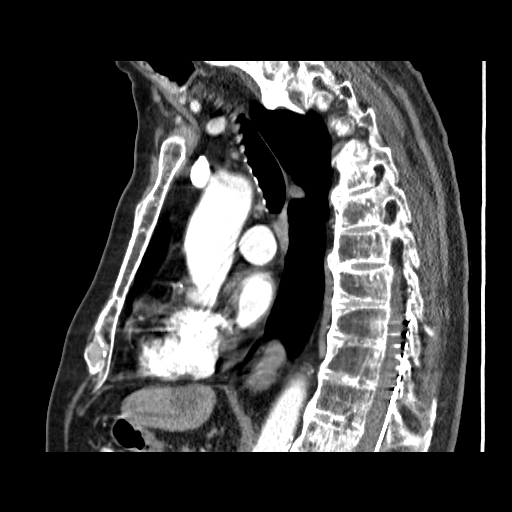
[im 136/181  soft-tissue]
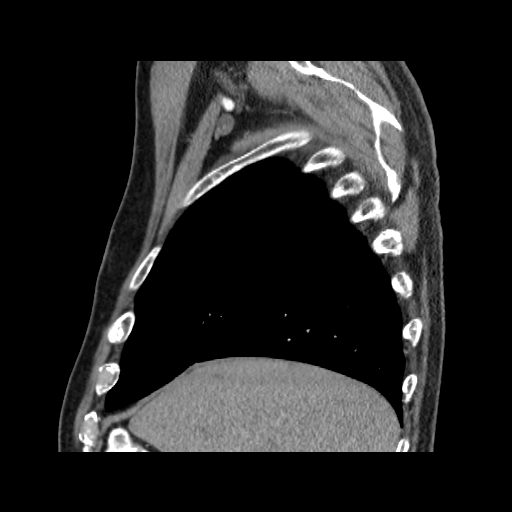

[Series 604: coronal · coronal · 0.96mm/px · 3 of 154 slices shown]
[im 39/154  soft-tissue]
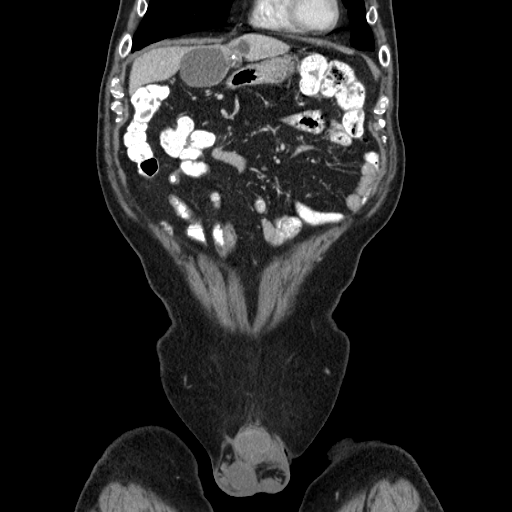
[im 77/154  soft-tissue]
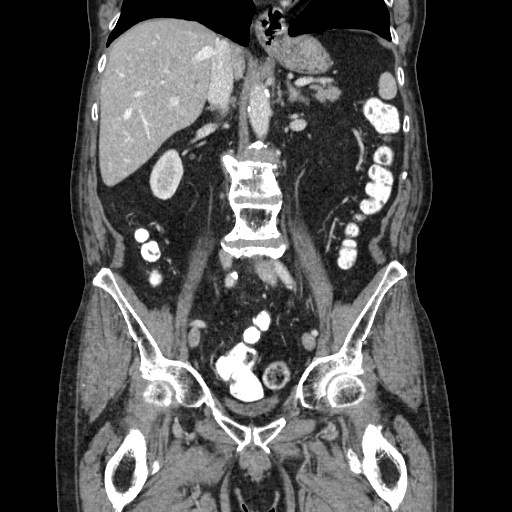
[im 115/154  soft-tissue]
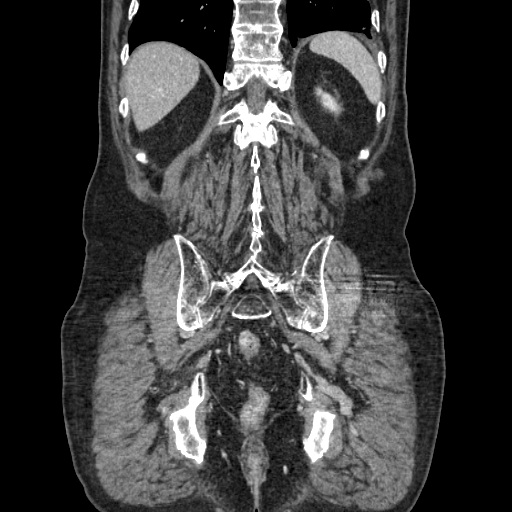

[Series 605: sagittal · sagittal · 0.96mm/px · 4 of 197 slices shown (2 of 2)]
[im 40/197  soft-tissue]
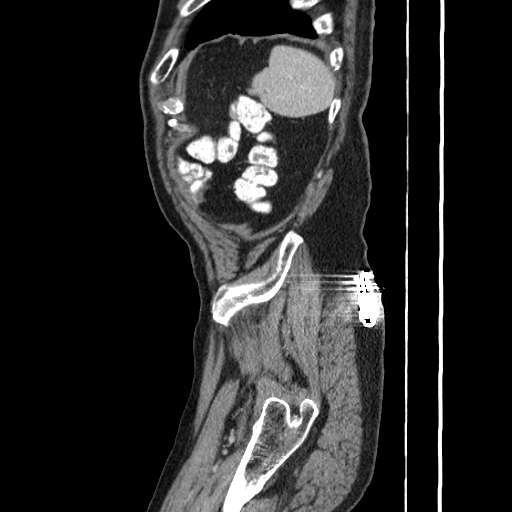
[im 79/197  soft-tissue]
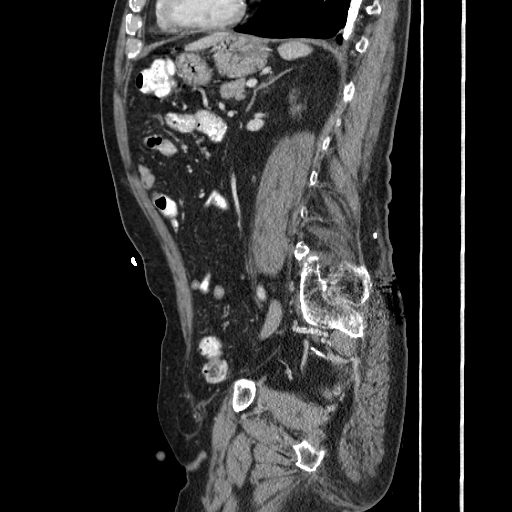
[im 118/197  soft-tissue]
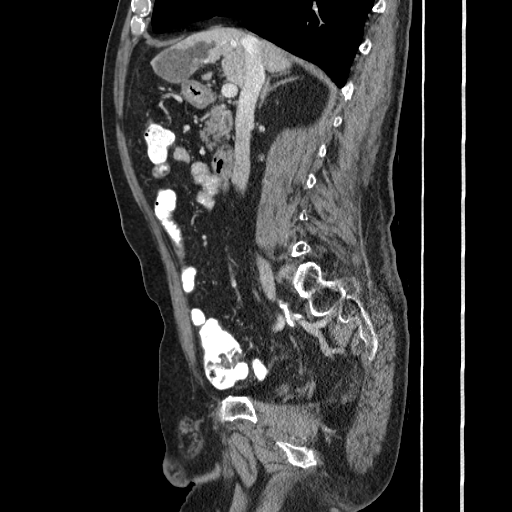
[im 157/197  soft-tissue]
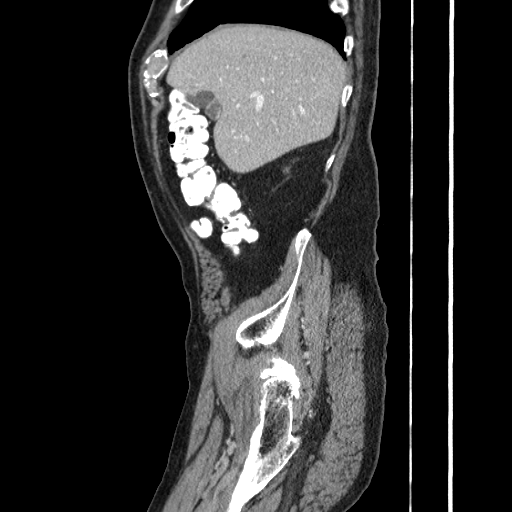

[14 of 32 positions shown; findings below may reference images not displayed]

Informed consent for contrast was obtained. Coronal and sagittal reconstruction images were generated and reviewed.
FINDINGS: Stable juxta fissural 5 mm superior right middle lobe nodule is unchanged from at least January 2019 and suggestive of a benign finding such as intrapulmonary lymph node. Calcified pleural plaque posterior left hemithorax is unchanged. No pleural effusions or pneumothorax. Patent central airways.
The heart and great vessels are within normal size limits. Small mixed type hiatal hernia is stable. No CT evidence for thoracic lymphadenopathy, by size criteria. There are few shotty mediastinal and bilateral hilar lymph nodes measuring up to 8 mm in the short axis and are unchanged.
Bones are severely osteopenic with exaggerated thoracic kyphosis. There is diffuse ankylosing thoracic spondylosis. A stimulating electrode overlies the posterior mid thoracic spinal cord.
Multifocal with an ossific lesions with increased density compatible with osteoblastic metastatic bone disease. The findings are much less conspicuous on today's exam and presumably representing positive interval treatment response.
IMPRESSION: Metastatic bone disease with marked decreased conspicuity of previously seen osteoblastic bone lesions. Findings suggestive of positive interval treatment response. This could be further evaluated with PET/CT nuclear medicine whole body bone scan, if indicated.
No evidence for metastatic disease or significant interval change within the chest.
Location code: 1

## 2021-08-15 IMAGING — CT CT CHEST WITH CONTRAST
4 of 7 series · 14 of 32 positions shown, 19 images · IV contrast (Isovue 370)
Comparison: Chest CT of 03/20/2021

Cancer surveillance, increased PSA; HX prostate cancer with bone mets; SURGICAL HX- spinal implant
FINAL REPORT:
INDICATION: Secondary malignant neoplasm of bone
Malignant neoplasm of prostate
Exam: CT of the chest with IV Contrast
All CT scans at this facility use iterative reconstruction technique, dose modulation and/or weight based dosing when appropriate to reduce radiation dose to as low as reasonably achievable.

[Series 3: abdomen/pelvis · axial · 0.89mm/px · z∈[-573,-280]mm · 4 of 196 slices shown, 9 images]
[im 40/196  soft-tissue]
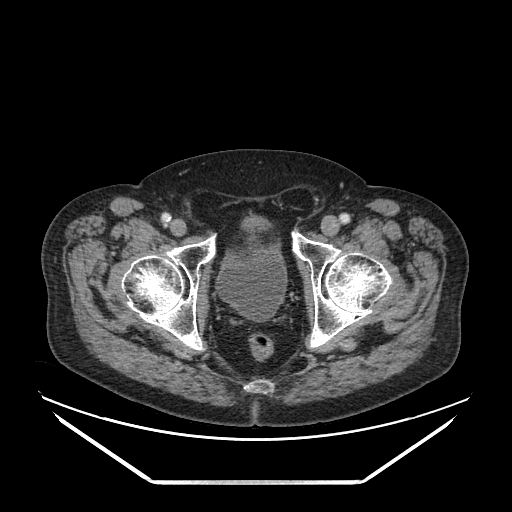
[im 40/196  lung]
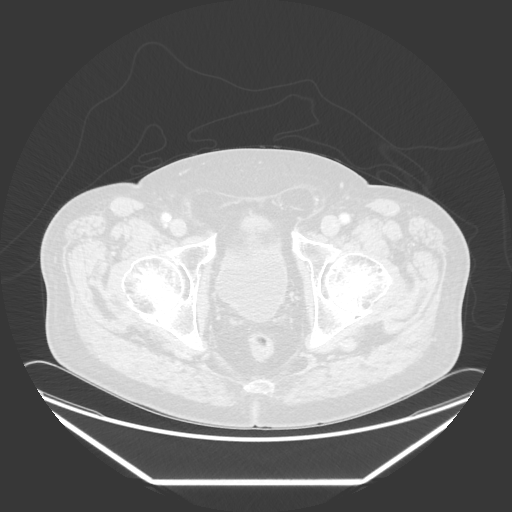
[im 40/196  bone]
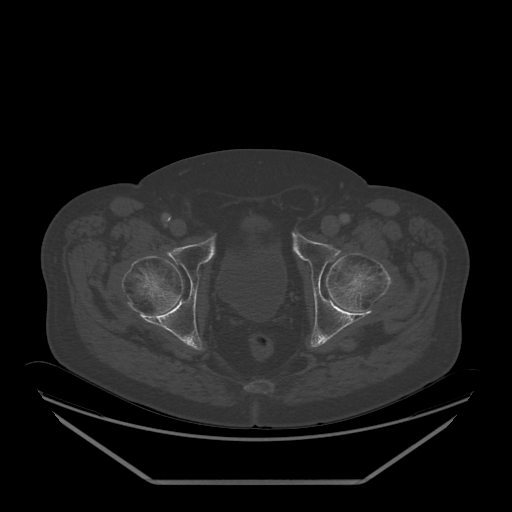
[im 79/196  soft-tissue]
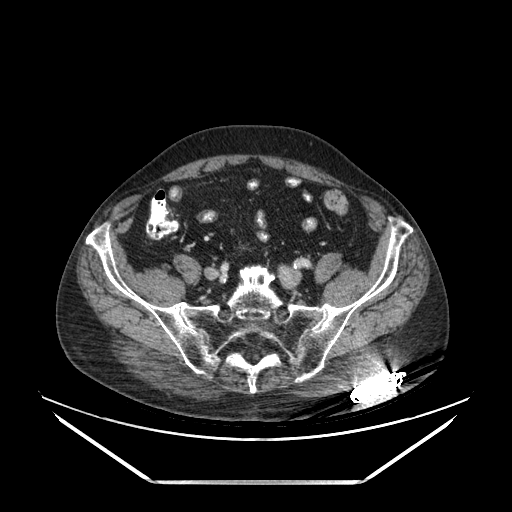
[im 79/196  lung]
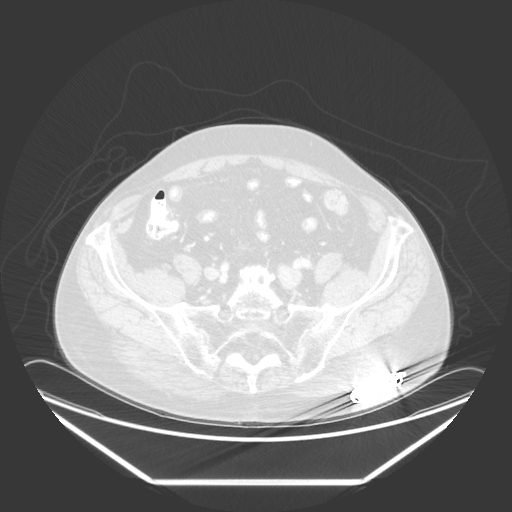
[im 118/196  soft-tissue]
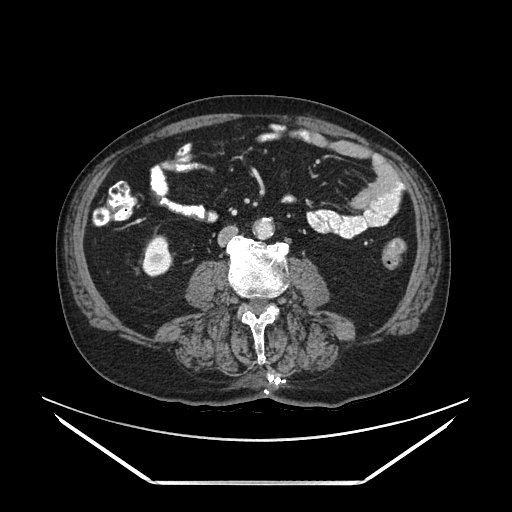
[im 118/196  lung]
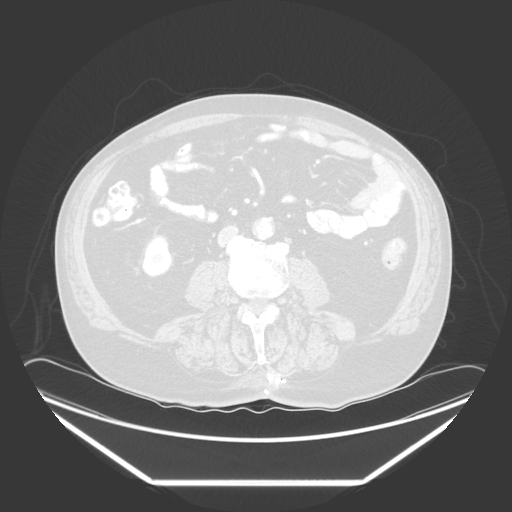
[im 157/196  soft-tissue]
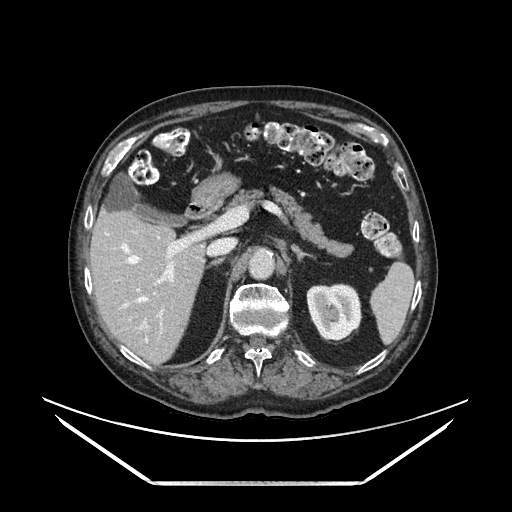
[im 157/196  lung]
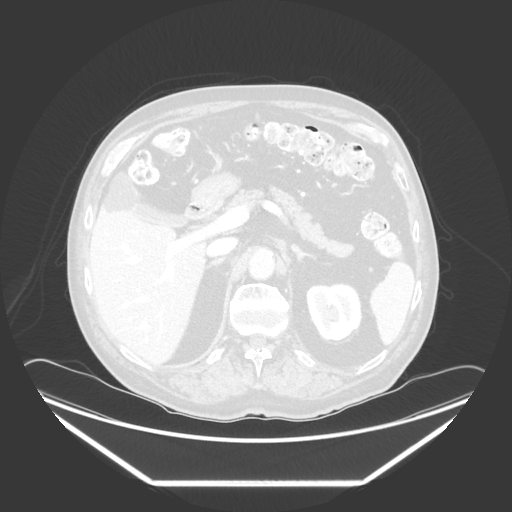

[Series 602: sagittal · sagittal · 0.71mm/px · 3 of 183 slices shown (1 of 2)]
[im 37/183  soft-tissue]
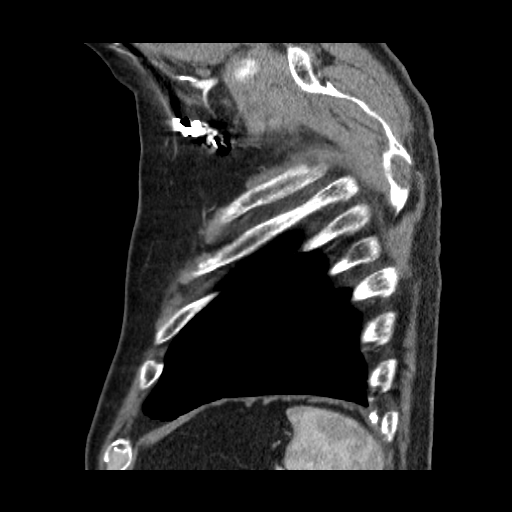
[im 73/183  soft-tissue]
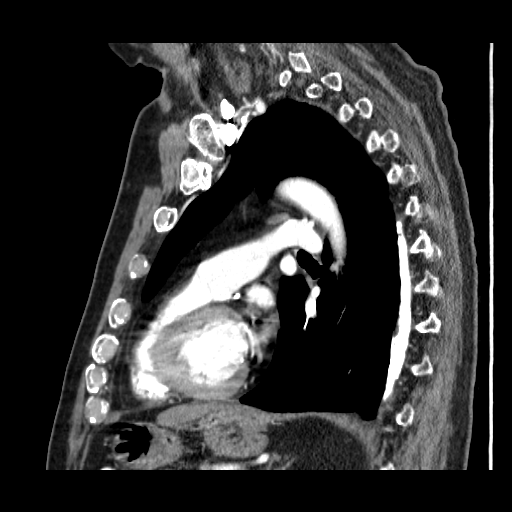
[im 110/183  soft-tissue]
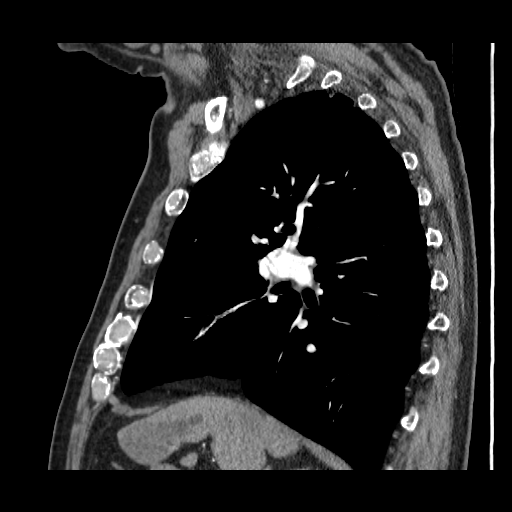

[Series 604: coronal · coronal · 0.95mm/px · 3 of 156 slices shown]
[im 39/156  soft-tissue]
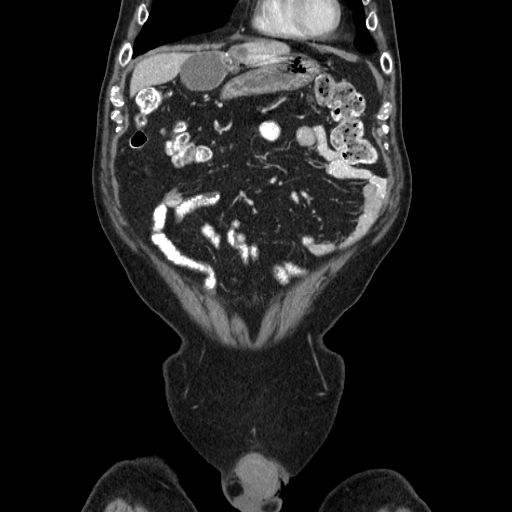
[im 78/156  soft-tissue]
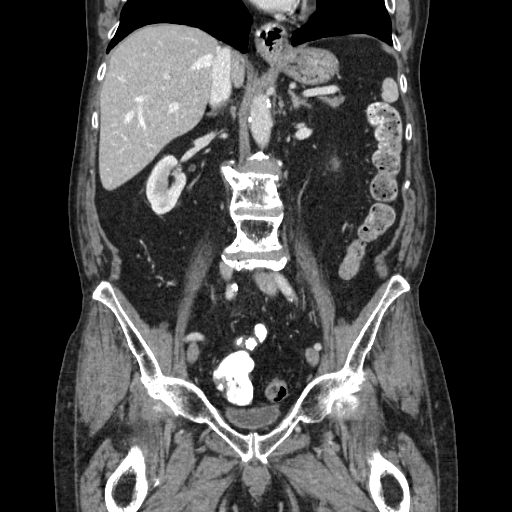
[im 117/156  soft-tissue]
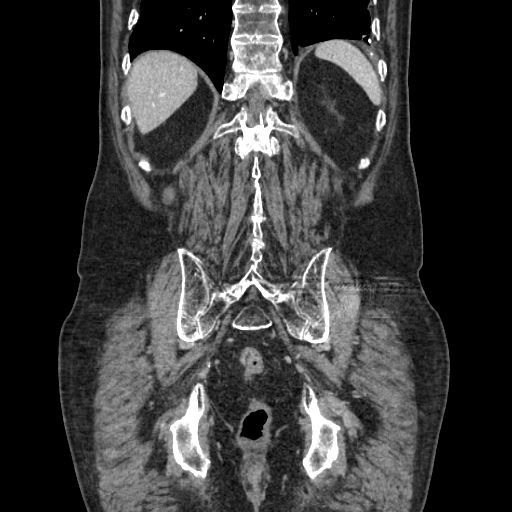

[Series 605: sagittal · sagittal · 0.95mm/px · 4 of 197 slices shown (2 of 2)]
[im 40/197  soft-tissue]
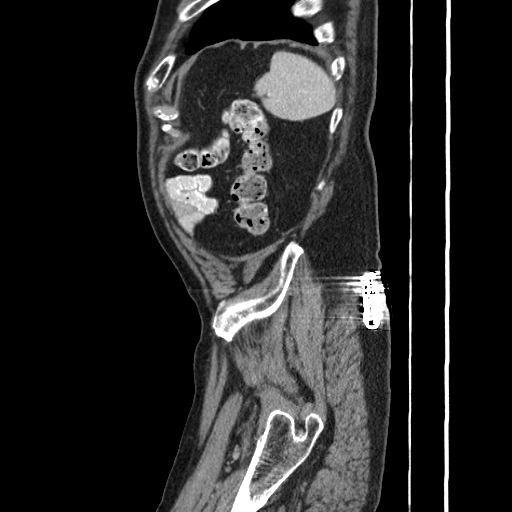
[im 79/197  soft-tissue]
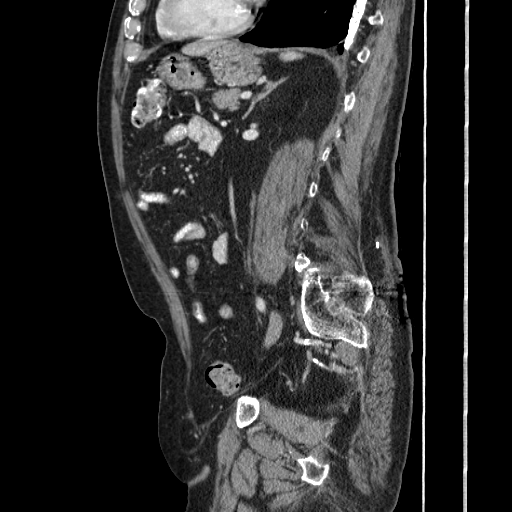
[im 118/197  soft-tissue]
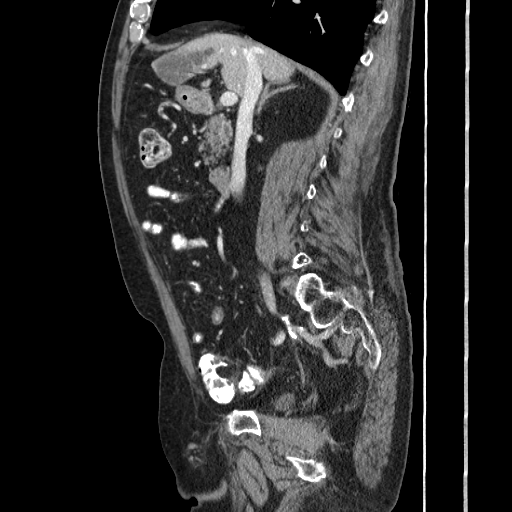
[im 157/197  soft-tissue]
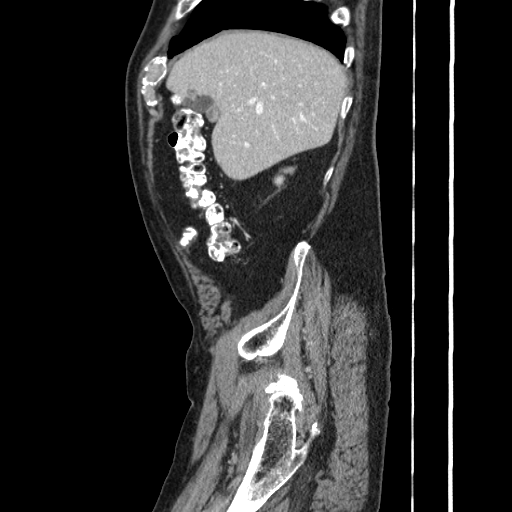

[14 of 32 positions shown; findings below may reference images not displayed]

FINDINGS: Lung parenchyma: Small 5 mm right middle lobe lung nodule abuts the fissure and is unchanged (series 4 image 69). No focal airspace consolidation or groundglass density.
Central airways: Patent
Pleural effusion: Left pleural-based calcification stable. No pleural effusion.
Adenopathy: No suspicious axillary, hilar, or mediastinal adenopathy.
Heart: Unremarkable. No pericardial effusion.
Upper abdomen: Well circumscribed hypodensities within the liver stable likely cysts. Some of these are mildly complex with peripheral calcifications.
Chest wall: Unremarkable.
Bones: Faint sclerotic lesions scattered about the bones, stable.
IMPRESSION: 
IMPRESSION: Stable osseous metastatic disease.
Stable 5 mm right middle lobe lung nodule.

## 2021-11-06 IMAGING — MR MRI PELVIS WITH AND WITHOUT CONTRAST
7 of 9 series · 38 of 48 positions shown · IV contrast (with contrast)
Comparison: CT abdomen and pelvis 08/15/2021

FINAL REPORT:
MRI PELVIS WITH AND WITHOUT CONTRAST
INDICATION: Prostate cancer.
TECHNIQUE: Multiplanar multisequence MR imaging of the pelvis was performed with and without intravenous contrast.

[Series 2: T1 · coronal · 5.0mm · 1.00mm/px · 6 of 39 slices shown (1 of 2)]
[im 1/39]
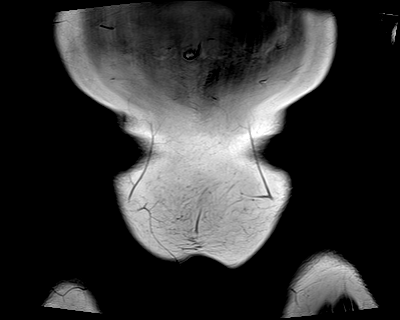
[im 8/39]
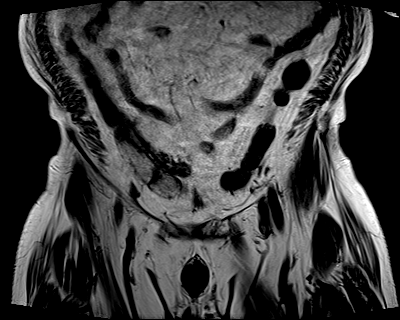
[im 16/39]
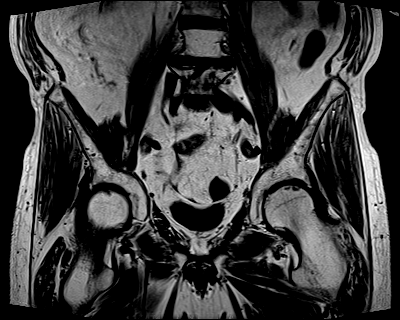
[im 23/39]
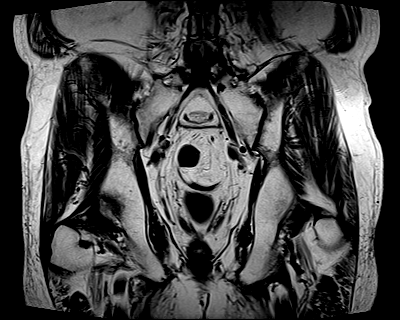
[im 31/39]
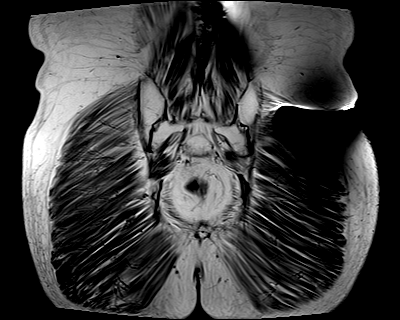
[im 39/39]
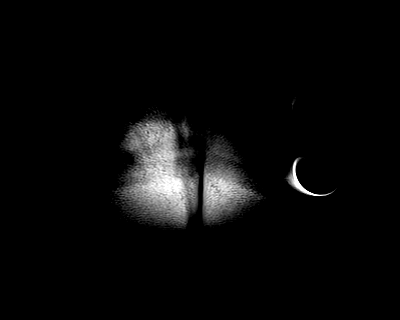

[Series 3: STIR · coronal · 5.0mm · 1.00mm/px · 6 of 39 slices shown (1 of 3)]
[im 1/39]
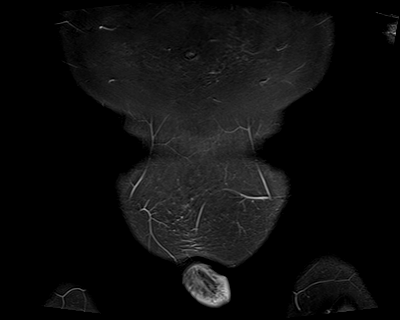
[im 8/39]
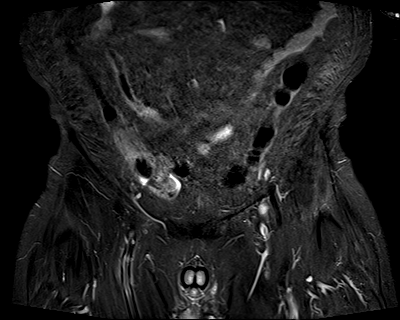
[im 16/39]
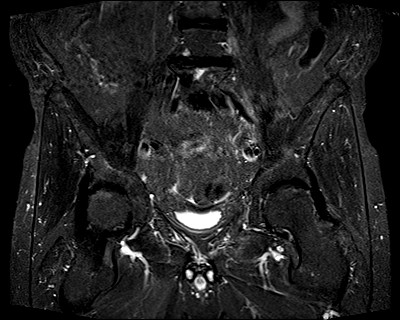
[im 23/39]
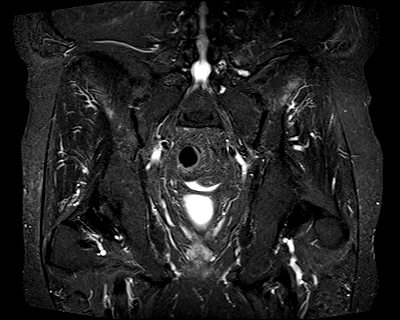
[im 31/39]
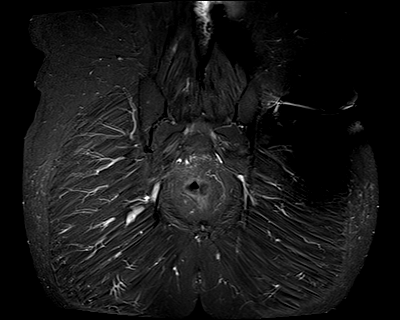
[im 39/39]
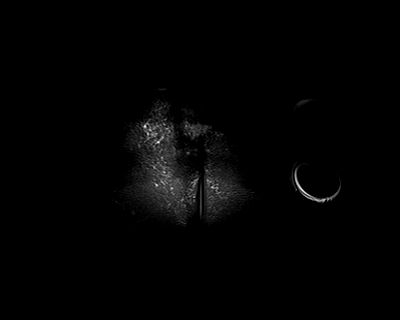

[Series 4: T1 · axial · 5.0mm · 0.89mm/px · z∈[-327,-70]mm · 6 of 44 slices shown (2 of 2)]
[im 1/44]
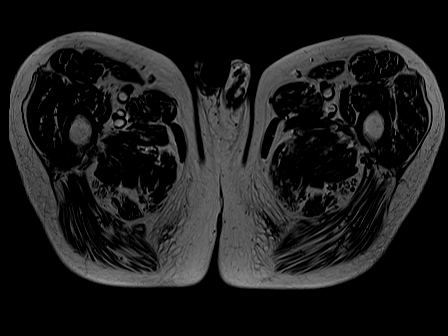
[im 9/44]
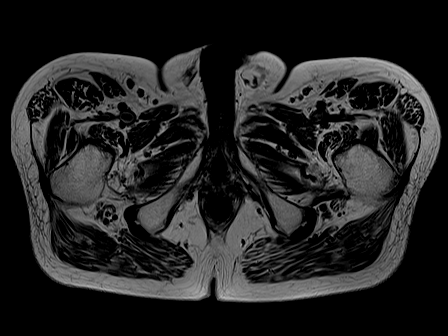
[im 18/44]
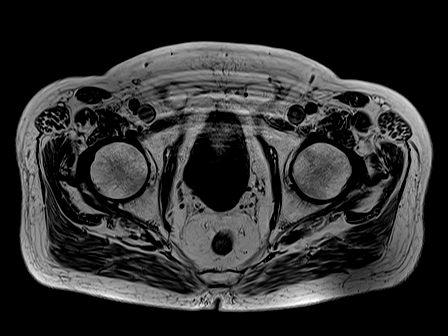
[im 26/44]
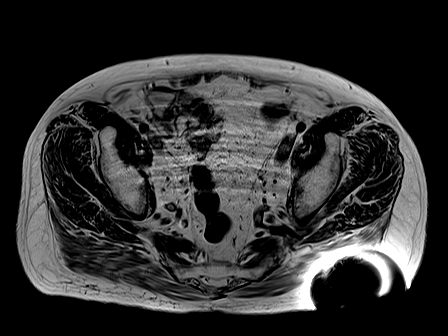
[im 35/44]
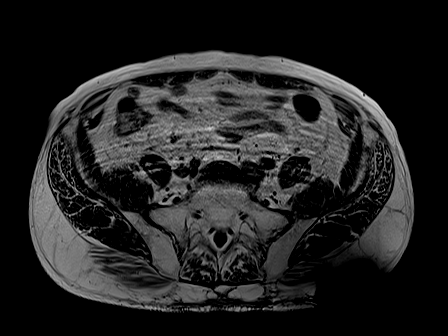
[im 44/44]
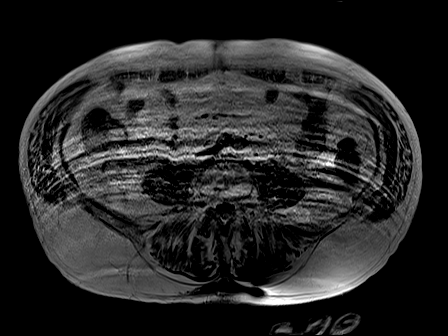

[Series 5: STIR · axial · 5.0mm · 1.04mm/px · z∈[-327,-70]mm · 6 of 44 slices shown (2 of 3)]
[im 1/44]
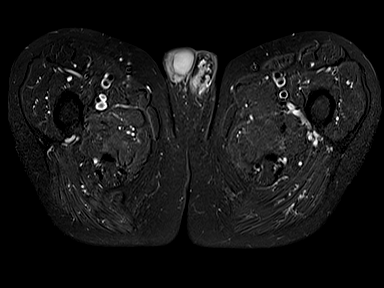
[im 9/44]
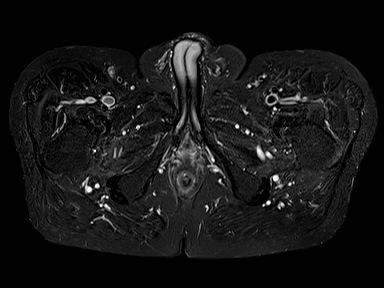
[im 18/44]
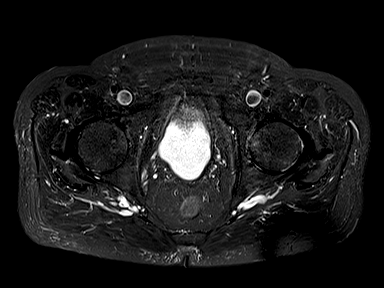
[im 26/44]
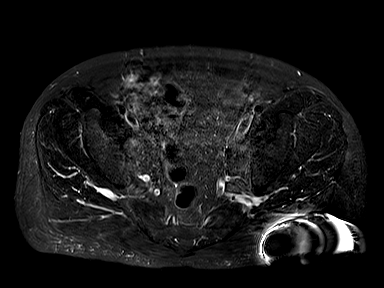
[im 35/44]
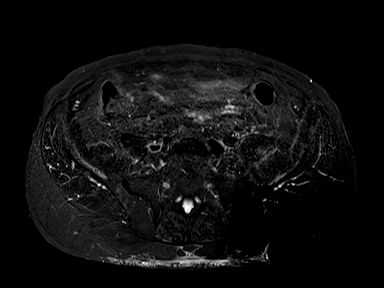
[im 44/44]
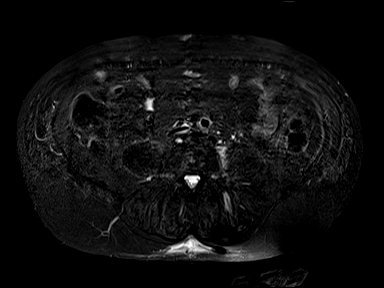

[Series 8: STIR · sagittal · 6.0mm · 0.94mm/px · 3 of 47 slices shown (3 of 3)]
[im 1/47]
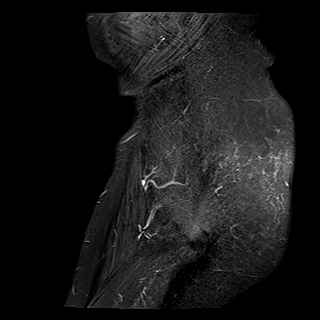
[im 10/47]
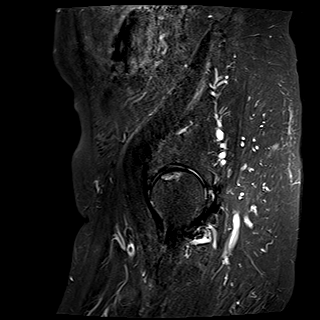
[im 19/47]
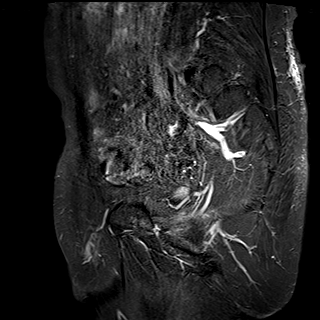

[Series 9: T1 fat-sat post-contrast · coronal · 5.0mm · 1.00mm/px · 5 of 39 slices shown (1 of 2)]
[im 1/39]
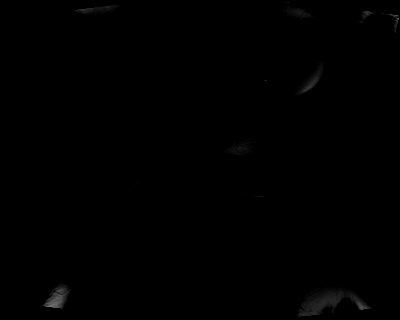
[im 10/39]
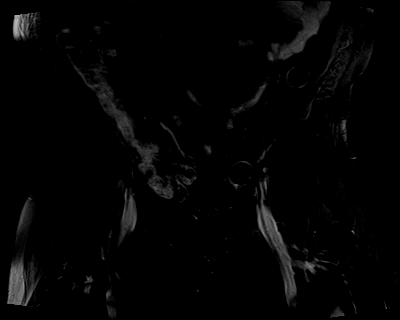
[im 20/39]
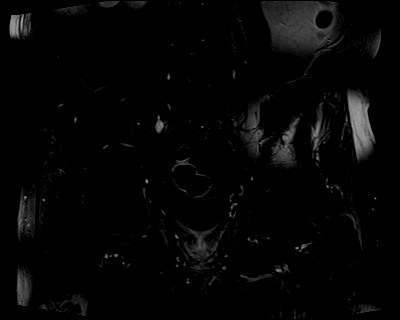
[im 29/39]
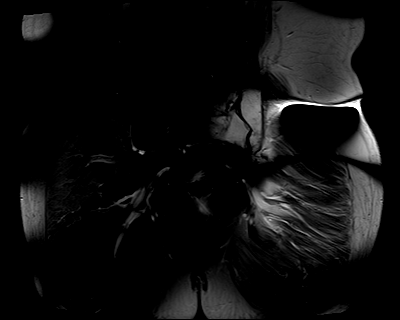
[im 39/39]
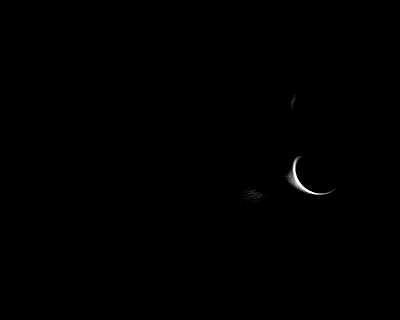

[Series 10: T1 fat-sat post-contrast · axial · 5.0mm · 0.89mm/px · z∈[-327,-70]mm · 6 of 44 slices shown (2 of 2)]
[im 1/44]
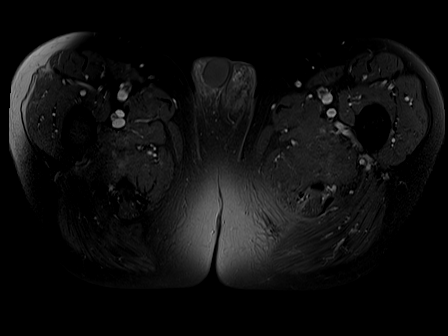
[im 9/44]
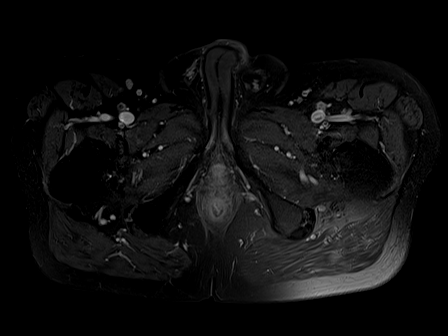
[im 18/44]
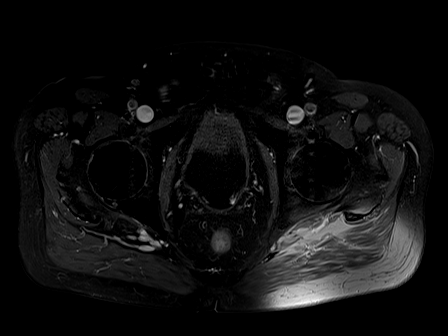
[im 26/44]
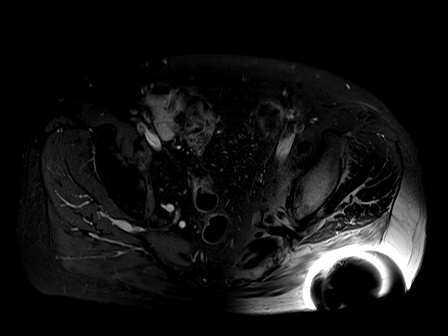
[im 35/44]
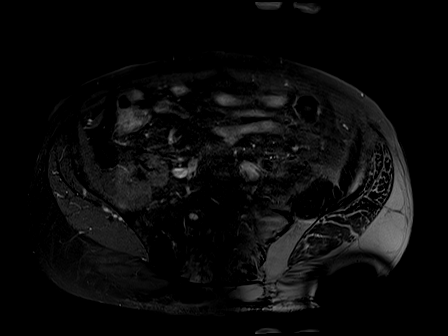
[im 44/44]
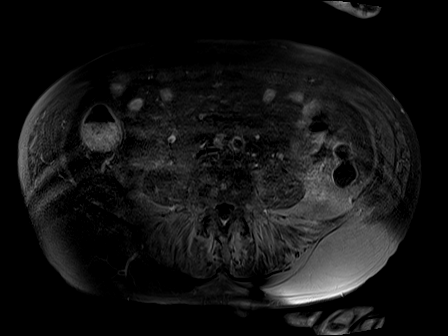

[38 of 48 positions shown; findings below may reference images not displayed]

FINDINGS: The prostate is small in size and heterogeneous without definite focal suspicious lesions. The bladder is normal. The imaged portions of the bowel demonstrate no acute process. No suspicious lymphadenopathy.
There is no evidence of a recent stress or traumatic fracture. There are small areas of avascular necrosis in the femoral heads. Lumbar spondylosis. Fatty appearance of the bone marrow in the pelvis which is probably related to posttreatment changes. There is heterogeneity of the bone marrow in the lower lumbar spine particularly at the L3 and L5 vertebral bodies.
IMPRESSION: 
IMPRESSION: 1. Bone marrow heterogeneity in the lower lumbar spine consistent with the patient's known history of osseous metastatic disease.
2. Small areas of avascular necrosis in the femoral heads without collapse.
3. No suspicious adenopathy.

## 2021-11-06 IMAGING — MR MRI LUMBAR SPINE WITH AND WITHOUT CONTRAST
8 series · 40 of 48 positions shown · non-contrast
Comparison: CT abdomen and pelvis 08/15/2021

FINAL REPORT:
MRI LUMBAR SPINE WITH AND WITHOUT CONTRAST
INDICATION: Prostate cancer.
TECHNIQUE: Multiplanar multisequence MR imaging of the lumbar spine was performed without contrast.

[Series 1: survey · sagittal · 8.0mm · 0.88mm/px · 2 of 8 slices shown]
[im 1/8]
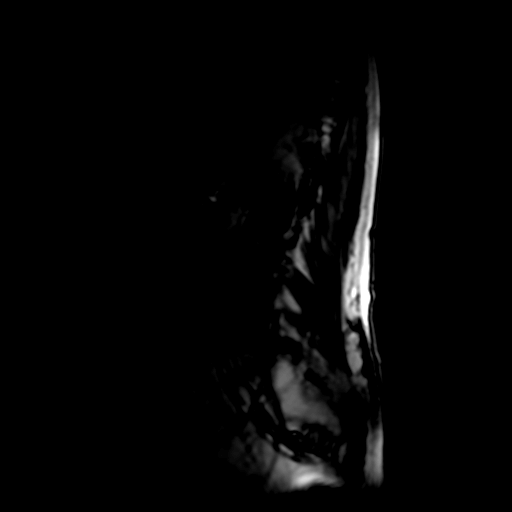
[im 8/8]
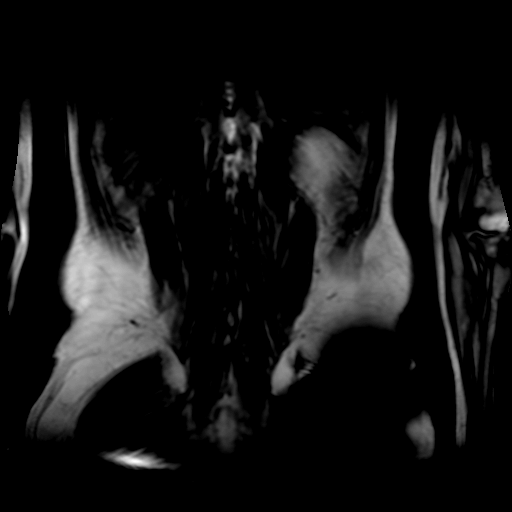

[Series 2: T2 · sagittal · 4.0mm · 0.73mm/px · 4 of 17 slices shown]
[im 1/17]
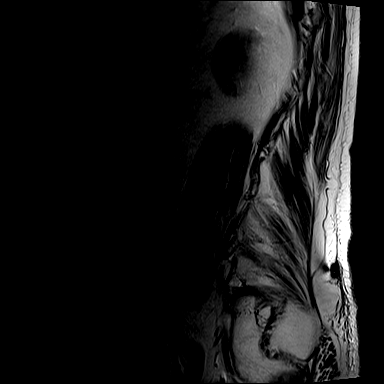
[im 6/17]
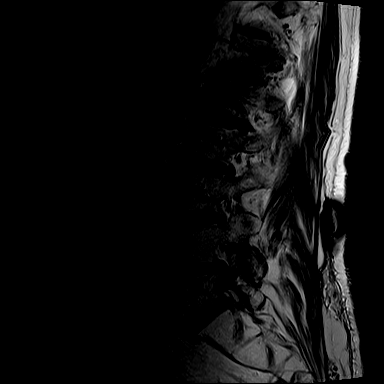
[im 11/17]
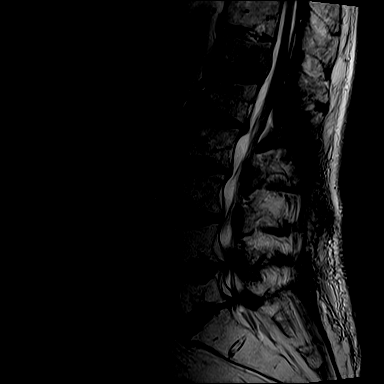
[im 17/17]
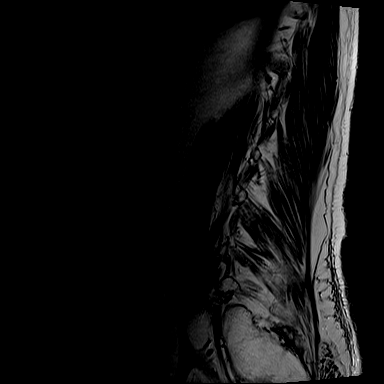

[Series 3: STIR · sagittal · 4.0mm · 0.88mm/px · 4 of 17 slices shown]
[im 1/17]
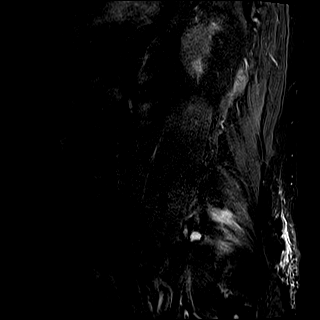
[im 6/17]
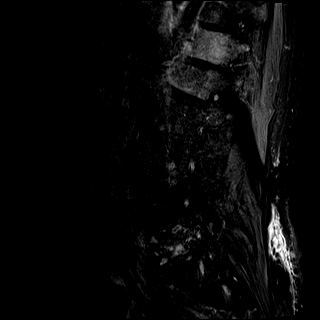
[im 11/17]
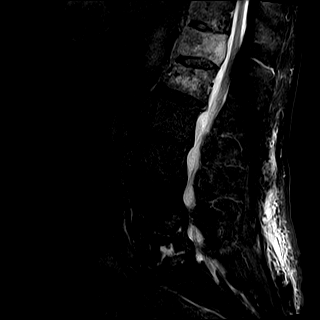
[im 17/17]
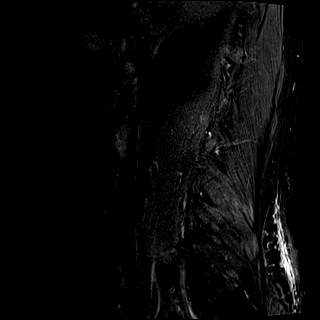

[Series 4: T1 · sagittal · 4.0mm · 0.88mm/px · 4 of 17 slices shown (1 of 2)]
[im 1/17]
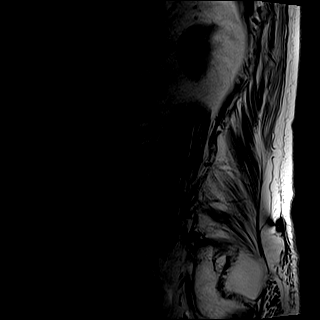
[im 6/17]
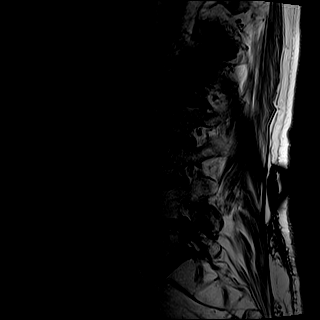
[im 11/17]
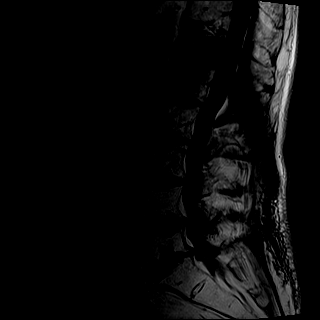
[im 17/17]
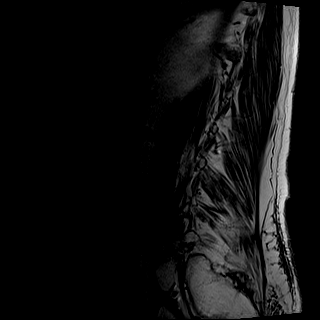

[Series 7: ax t2_composed · axial · 4.0mm · 0.69mm/px · z∈[-136,+97]mm · 8 of 40 slices shown]
[im 1/40]
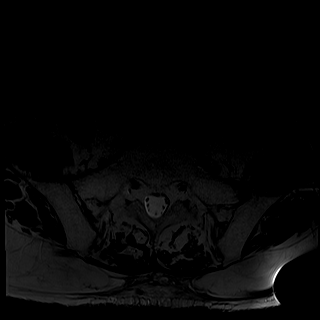
[im 5/40]
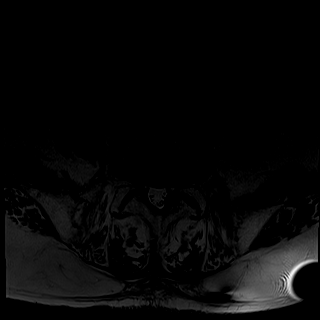
[im 14/40]
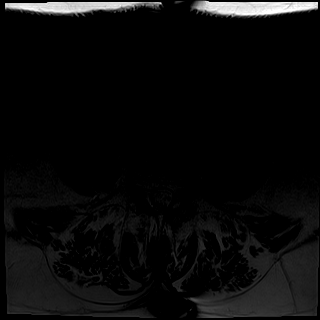
[im 18/40]
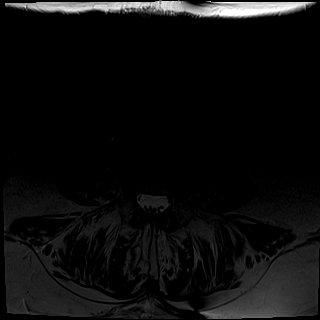
[im 22/40]
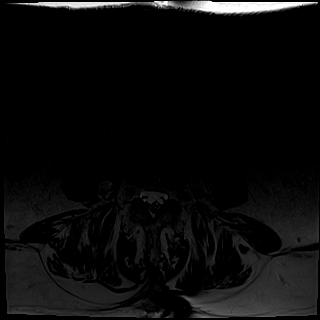
[im 27/40]
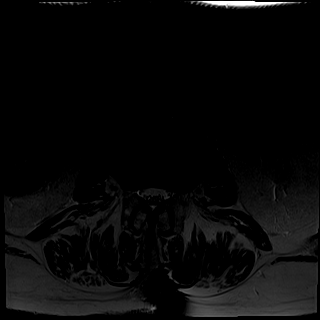
[im 35/40]
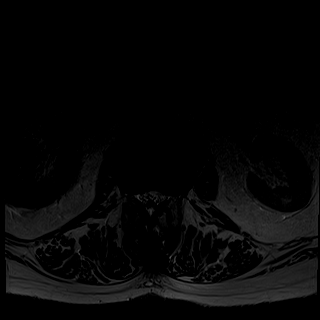
[im 40/40]
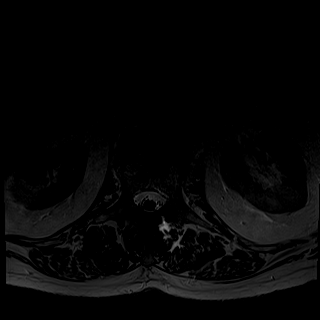

[Series 10: ax t1_composed · axial · 4.0mm · 0.69mm/px · z∈[-136,+43]mm · 6 of 40 slices shown]
[im 1/40]
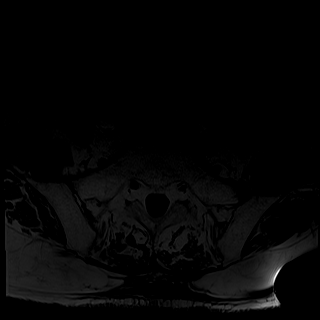
[im 5/40]
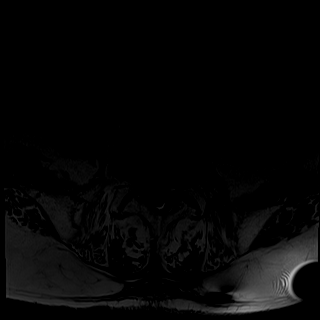
[im 14/40]
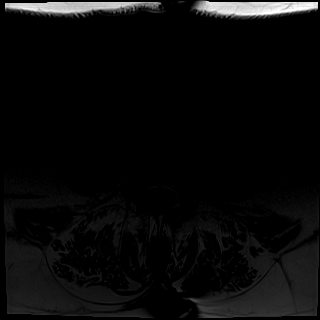
[im 18/40]
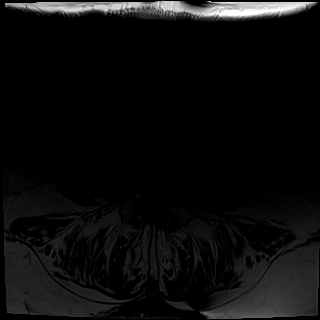
[im 22/40]
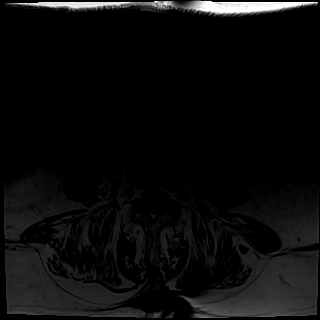
[im 27/40]
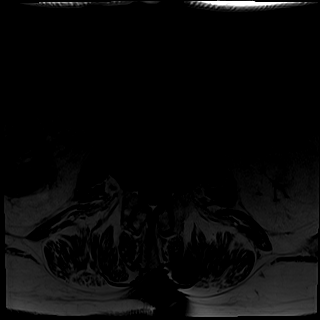

[Series 22: T1 · axial · 4.0mm · 0.69mm/px · z∈[-136,+97]mm · 8 of 40 slices shown (2 of 2)]
[im 1/40]
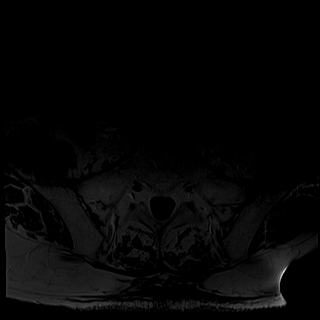
[im 5/40]
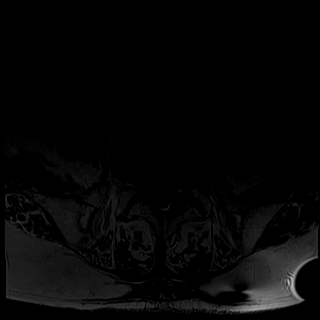
[im 14/40]
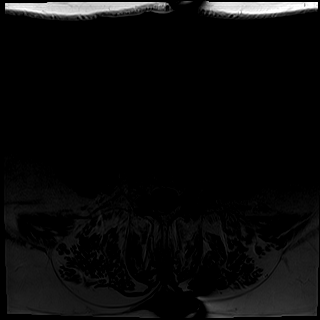
[im 18/40]
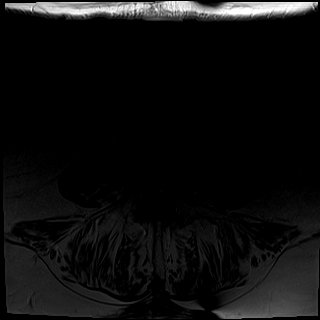
[im 22/40]
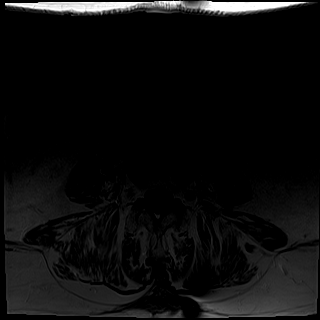
[im 27/40]
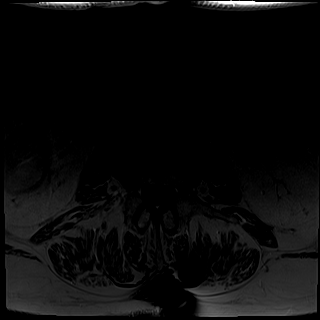
[im 35/40]
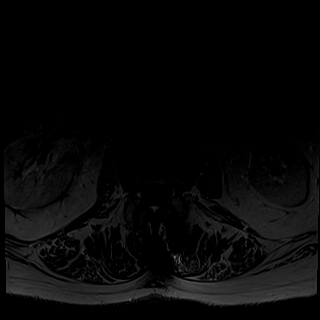
[im 40/40]
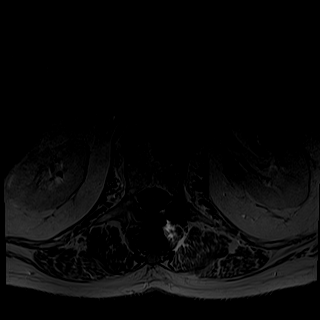

[Series 23: T1 fat-sat post-contrast · sagittal · 4.0mm · 0.88mm/px · 4 of 17 slices shown]
[im 1/17]
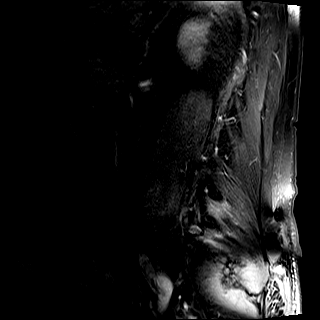
[im 6/17]
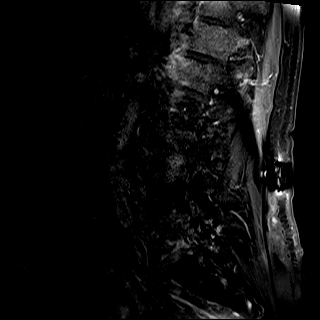
[im 11/17]
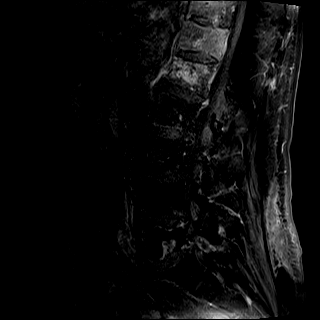
[im 17/17]
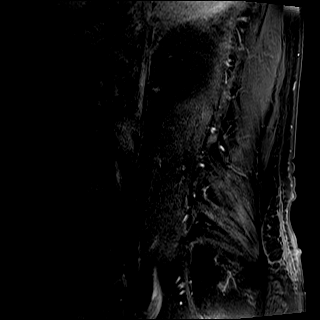

[40 of 48 positions shown; findings below may reference images not displayed]

FINDINGS: There are 5 lumbar-type vertebral bodies. Alignment is normal. There is mild loss of height of the L5 vertebral body, chronic in appearance. There is mild loss of height of the superior endplate of L1 which is new compared to the prior CT. The remaining vertebral body heights are preserved.
There is multifocal osseous metastatic disease with low T1 signal and enhancement involving a large portion of the T12 vertebral body with associated posterior bulging of the vertebral cortex causing mild canal stenosis. There is low T1 signal and enhancement in a large portion of the L1 vertebral body. There are other scattered smaller foci of low T1 signal and postcontrast enhancement best seen at the L3 and L5 vertebral bodies.
The imaged portions of the cord are normal. The conus tip ends at L2 and is not compressed or expanded.
L1-2: Disc bulge with mild facet hypertrophy causing mild canal stenosis with mild bilateral neural foraminal narrowing.
L2-3: Small disc bulge with mild facet hypertrophy causing mild canal stenosis with no significant neural foraminal narrowing.
L3-4: Small disc bulge with no significant canal or neural foraminal stenosis.
L4-5: Disc bulge with moderate facet hypertrophy causing mild canal stenosis with mild bilateral neural foraminal narrowing.
L5-S1: Disc bulge with mild facet hypertrophy causing moderate bilateral neural foraminal narrowing. No significant canal stenosis.
There is a spinal stimulator in place which enters the canal at the T12-L1 level.
IMPRESSION: 
IMPRESSION: 1. Multifocal osseous metastatic disease most pronounced at the T12 and L1 vertebral bodies. There is associated posterior bulging of the T12 cortex causing mild canal stenosis.
2. Mild pathologic compression deformity of the L1 vertebral body which is new compared to the prior CT abdomen and pelvis 08/15/2021.

## 2022-04-20 IMAGING — DX XR KNEE 2 VIEWS RIGHT
1 series · 2 of 2 positions shown · non-contrast
Comparison: none

FINAL REPORT:
XR KNEE 2 VIEWS RIGHT
INDICATION: Provided clinical information: "Knee pain and swelling." Additional information: Post operative evaluation. Evaluation of surgical hardware.
TECHNIQUE: Post operative radiographic images of the right knee.
_______________

[Series 6876: AP · right · 0.10mm/px · 2 of 2 slices shown]
[im 1/2]
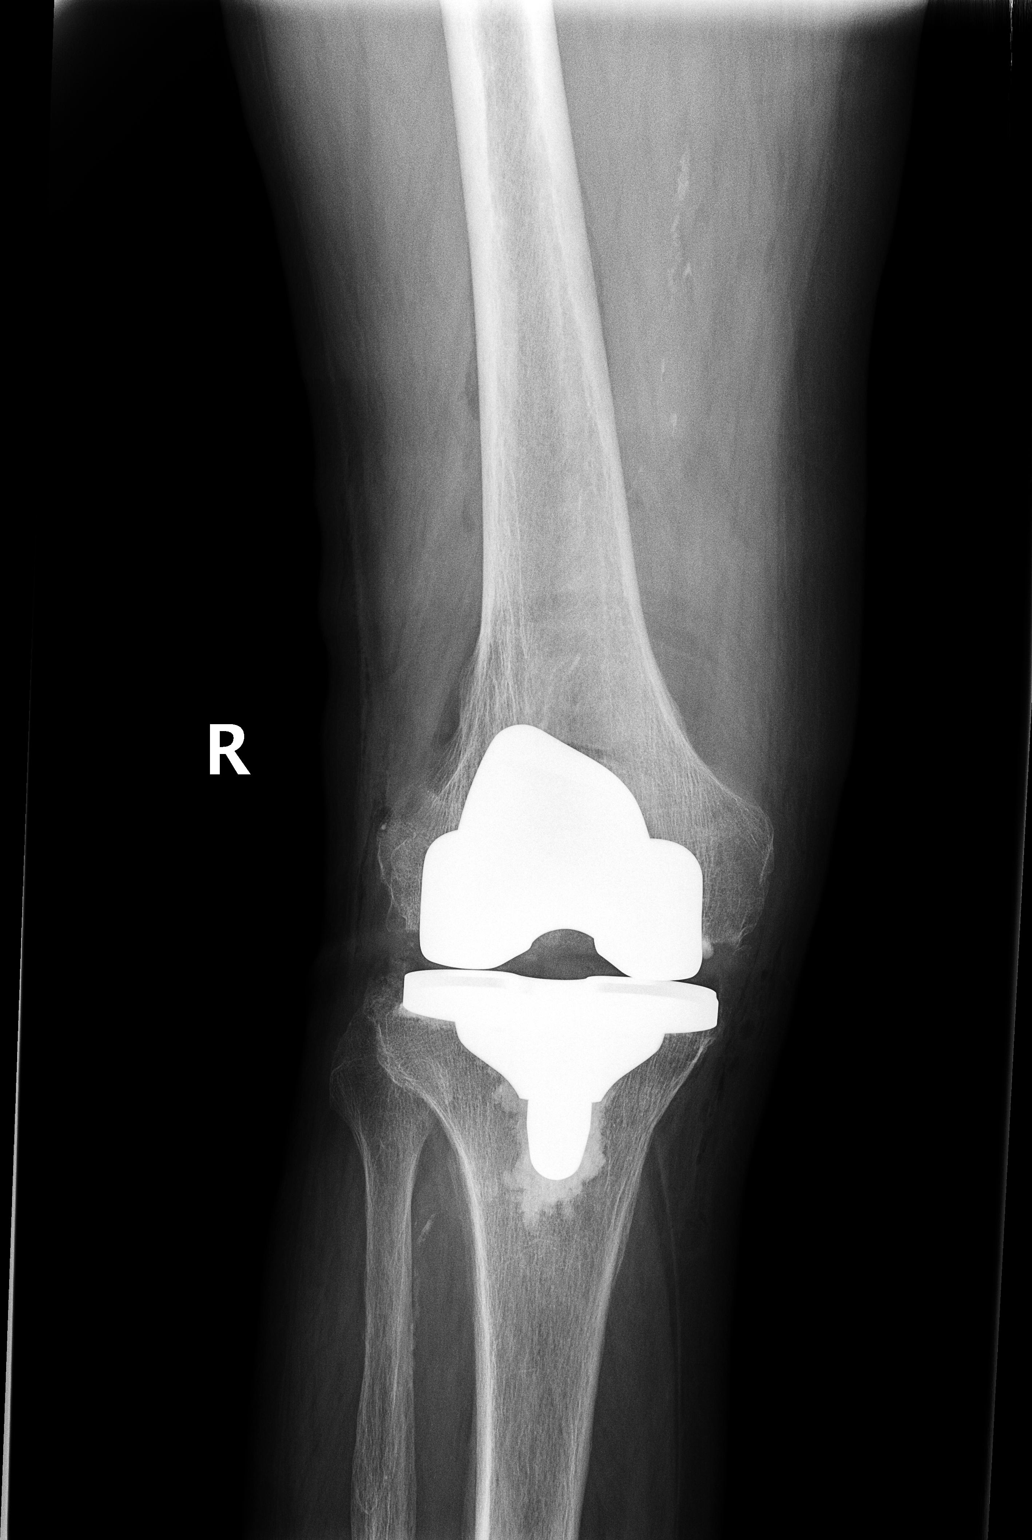
[im 2/2]
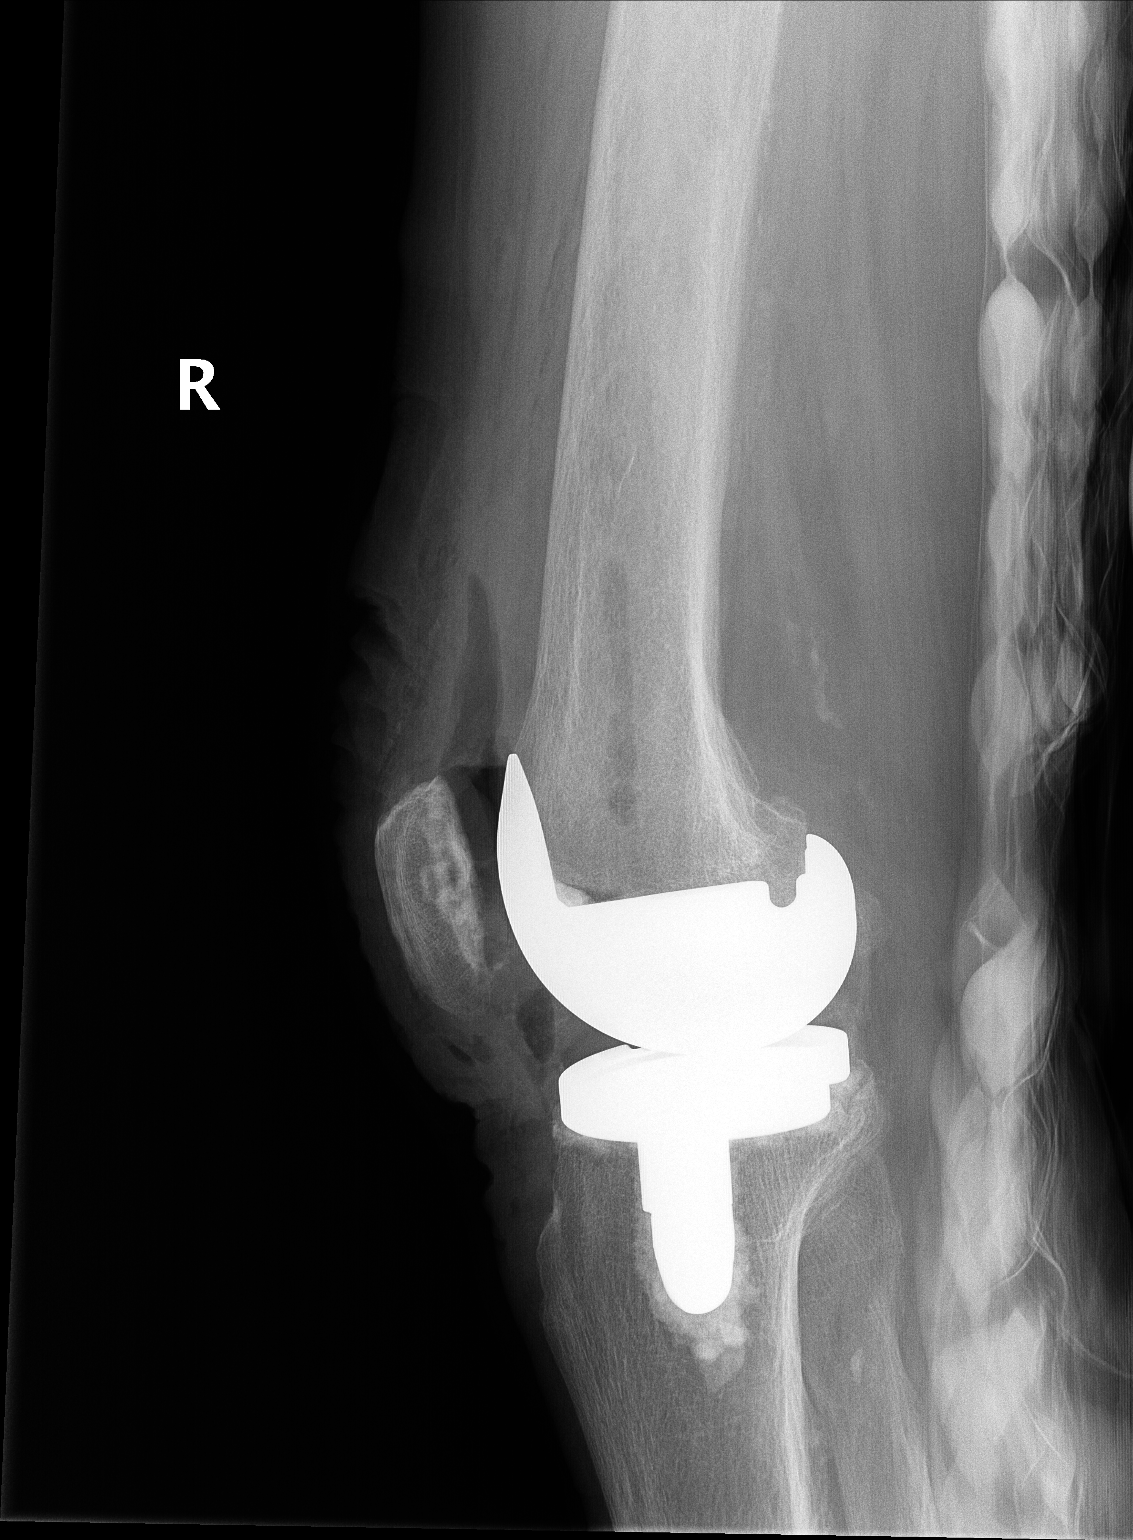

[2 of 2 positions shown; findings below may reference images not displayed]

FINDINGS: Postoperative images obtained.
Anatomic alignment of the bones/joints.
No evidence of hardware failure or loosening.
Expected degree of intra-articular fluid/air in the postoperative setting.
No radiopaque foreign bodies are evident.
No concerning features.
_______________
IMPRESSION: Expected postoperative appearance of the right knee without concerning features.

## 2022-09-03 IMAGING — CT PET^TMC_SKULL_THIGH (Adult)
1 of 7 series · 3 of 16 positions shown, 4 images · non-contrast
Comparison: None
CORRELATING STUDIES: MRI pelvis 11/06/2021, MRI lumbar spine 11/06/2021, CT abdomen and pelvis and CT chest 08/15/2021

FINAL REPORT:
EXAM: PET PSMA IMAGING
HISTORY: Malignant neoplasm of prostate
Secondary malignant neoplasm of bone
TECHNIQUE: Following the IV injection of 9.84 mCi of piflufolastat F18 into the left arm IV, PET scan was performed in the routine fashion. A noncontrast CT scan was performed from the base of the skull down through the pelvis for attenuation purposes. These images were also reviewed but are limited by the absence of  IV and oral contrast. They are used primarily for localization purposes. The high background radiotracer activity in the liver and visualized portions of the brain limit detection of metastatic disease in these areas in some cases.
Mediastinal blood pool (SUV max):
Hepatic background activity (SUV max):
HEAD AND NECK:
No cervical or supraclavicular lymphadenopathy. There is physiologic tracer uptake within the salivary glands. There is indeterminate focus of PSMA uptake at the skull base in the left petrous apex and clivus with maximum SUV of 9.3 (image 439) and without a definite CT correlate.
CHEST:
Multiple enlarged mediastinal lymph nodes with intense tracer uptake consistent with metastases, for example right paratracheal node measuring 2.5 x 1.9 cm (image 109) with maximum SUV of 19.0 and a subcarinal lymph node measuring 2.0 x 1.6 cm with maximum SUV of 13.6. There are bilateral enlarged hilar lymph nodes with intense metabolic activity, SUV max up to 11.0 cm on the right and 10.0 cm on the left.
Extensive tiny bilateral pulmonary nodules in a perifissural distribution, likely metastases, the majority are below the resolution of PET. These new since prior chest CT. There is diffuse nodular thickening of the fissures. For reference confluent nodules along the left major fissure measuring up to 1.7 cm (image 129) with maximum SUV of 4.7.
Normal heart size. No pericardial effusion. Dense coronary artery calcification.
ABDOMEN/PELVIS:
There are multiple cystic lesions in the liver without tracer uptake, likely cysts. Cholelithiasis. The spleen and pancreas are unremarkable.
New left adrenal nodule measuring 1.1 cm with intense tracer uptake (maximum SUV of 10.0), likely metastatic. The right adrenal gland is normal.
Nonobstructing 4 mm right renal calculus. No hydronephrosis. Small hiatal hernia. No bowel obstruction or acute inflammatory changes.
Prostate is atrophic. Fiducial markers in the prostate. No focal uptake in the prostate to suggest local recurrence. Small fat-containing left inguinal hernia. Severe aortoiliac atherosclerosis.
No upper abdominal, retroperitoneal, or pelvic lymphadenopathy. Spinal stimulator device in the left gluteal soft tissues.
BONES/SOFT TISSUES:
There is extensive PSMA avid metastatic disease throughout the spine, bilateral ribs, bilateral scapula, bilateral medial clavicles, sternum, pelvis, sacrum and bilateral proximal femurs. For reference, L3 lesion with maximum SUV of 26, an expansile lytic lesion of the left anterior first rib measures 3.3 x 2.8 cm (image 101) with maximum SUV of 16.2.

[Series 4: ct wb · axial · 0.98mm/px · z∈[-816,-378]mm · 3 of 439 slices shown, 4 images]
[im 110/439  soft-tissue]
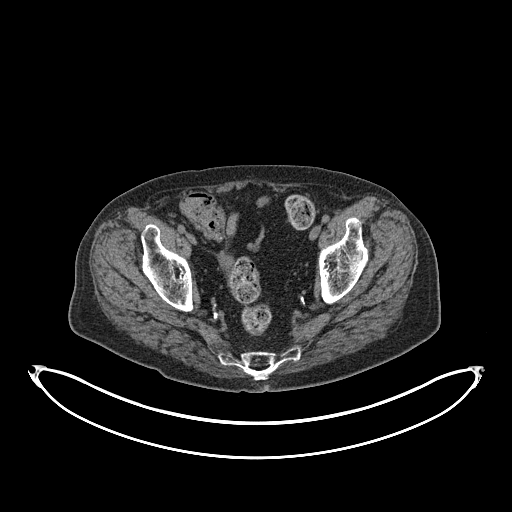
[im 110/439  bone]
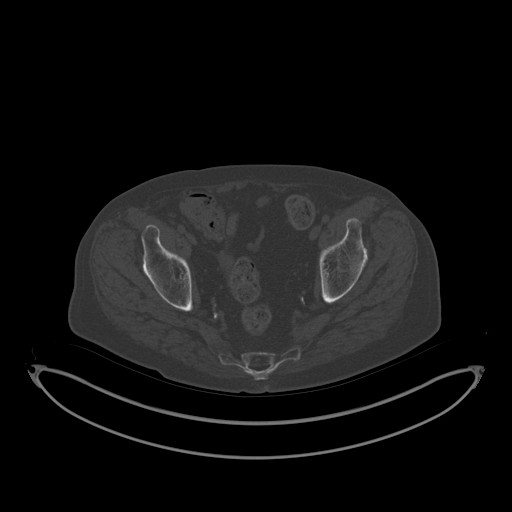
[im 220/439  soft-tissue]
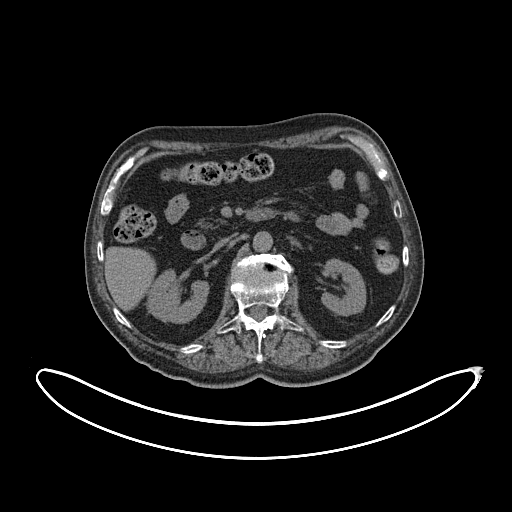
[im 329/439  soft-tissue]
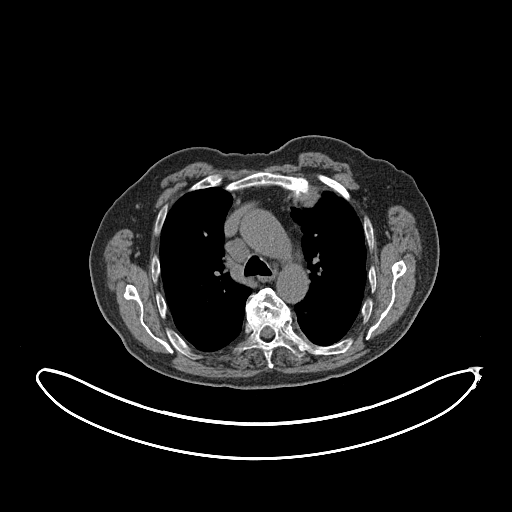

[3 of 16 positions shown; findings below may reference images not displayed]

IMPRESSION: 1. New PSMA avid mediastinal and bilateral hilar lymphadenopathy and extensive perifissural nodularity throughout the lungs, likely metastatic disease.
2. Indeterminate foci of osseous tracer uptake at the skull base. This is further evaluated with MRI of the brain with contrast.
3. Extensive osseous metastatic disease throughout the neck, chest, abdomen, and pelvis.
4. New 1.1 cm left adrenal nodule with intense tracer uptake, likely metastatic.
5. Posttreatment changes of the prostate. No uptake to suggest local recurrence.

## 2022-10-30 IMAGING — CR XR CHEST 2 VIEWS
2 series · 2 of 2 positions shown · non-contrast
Comparison: CT of 08/15/2021

FINAL REPORT:
HISTORY: Malignant neoplasm of the prostate
Number of views:2

[PA]
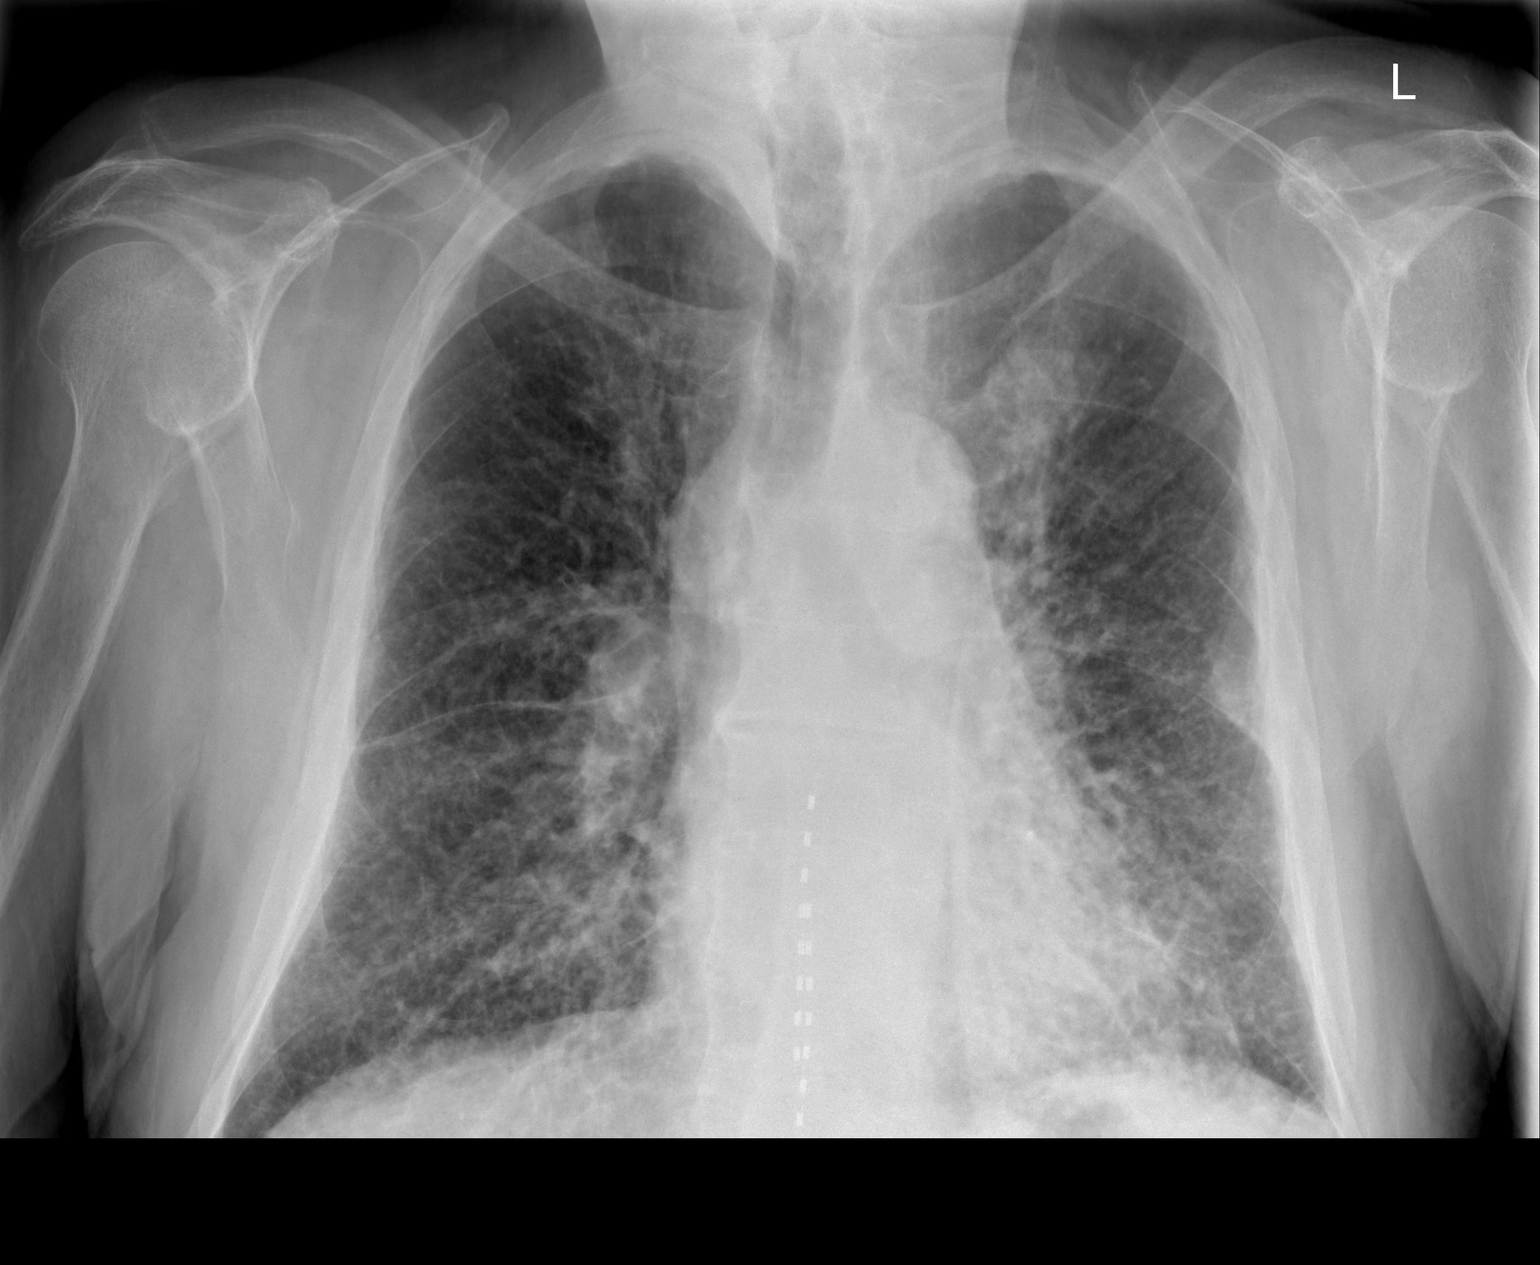

[left lateral]
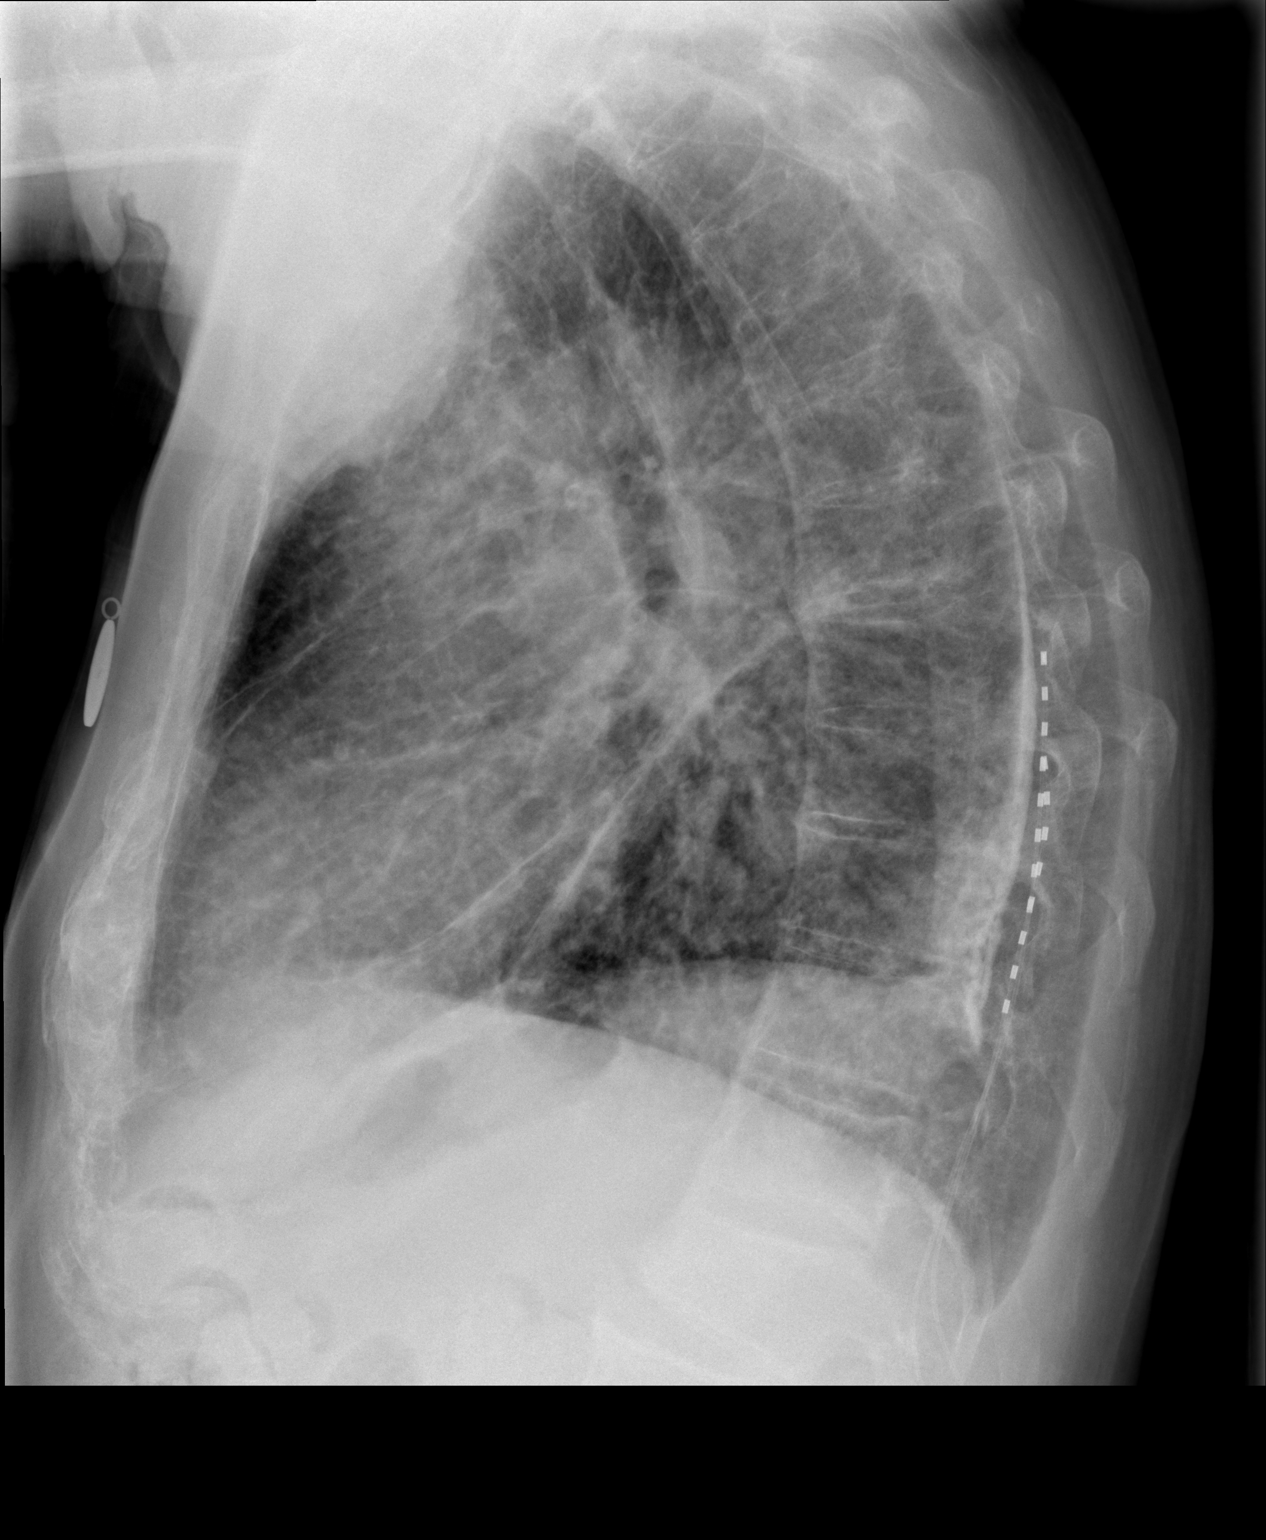

[2 of 2 positions shown; findings below may reference images not displayed]

FINDINGS: Mild aortic ectasia. Heart is not enlarged. No aggressive bone lesions are seen. Spine stimulator wires. Multilevel thoracic anterior osteophytes which are thin. Pleural-based calcification on the left is stable. There is an increase in lung markings which is new and appears reticulonodular.
IMPRESSION: 
IMPRESSION: 
IMPRESSION: 1. New reticulonodular opacities diffusely which are moderate within both lungs. This could represent atypical viral infection, vascular congestion, or miliary metastatic disease. Suggest CT chest for further characterization
2. Stable calcified pleural plaque
3. Stable chronic bony findings. The osseous metastatic disease noted previously is not demonstrated on this examination

## 2022-11-05 IMAGING — CT CT CHEST PULMONARY EMBOLISM WITH IV CONTRAST
3 of 6 series · 18 of 36 positions shown · IV contrast (agent unspecified)
Comparison: 08/15/2021

FINAL REPORT:
INDICATION: Malignant neoplasm of prostate
Secondary malignant neoplasm of bone
Malignant neoplasm of prostate
Exam: CT PE study with IV contrast. 3-D MIP reformats were performed.
TECHNIQUE: Bolus tracking utilizes noncontrast images. Then, postcontrast images were obtained through the chest.
All CT scans at this facility use iterative reconstruction technique, dose modulation and/or weight based dosing when appropriate to reduce radiation dose to as low as reasonably achievable.

[Series 2: chest pe · axial · 0.79mm/px · z∈[-287,-7]mm · 10 of 138 slices shown]
[im 13/138  lung]
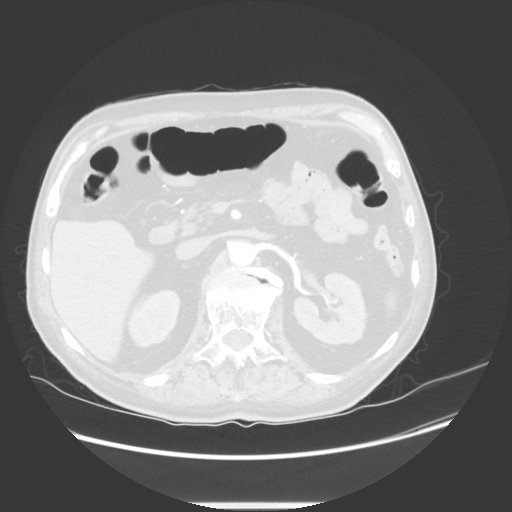
[im 25/138  mediastinal]
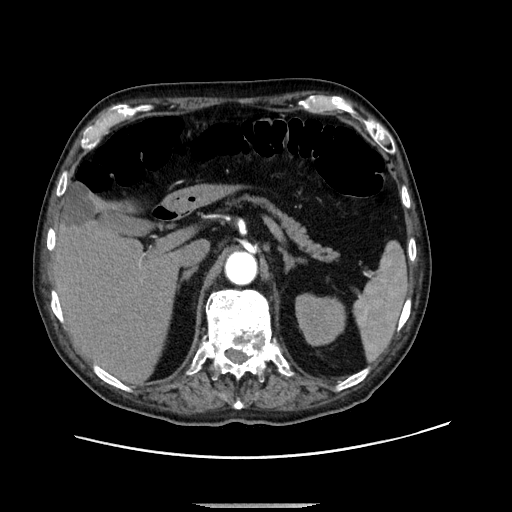
[im 38/138  lung]
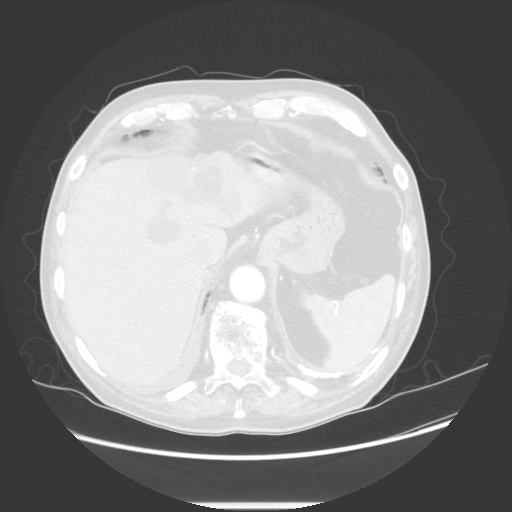
[im 50/138  mediastinal]
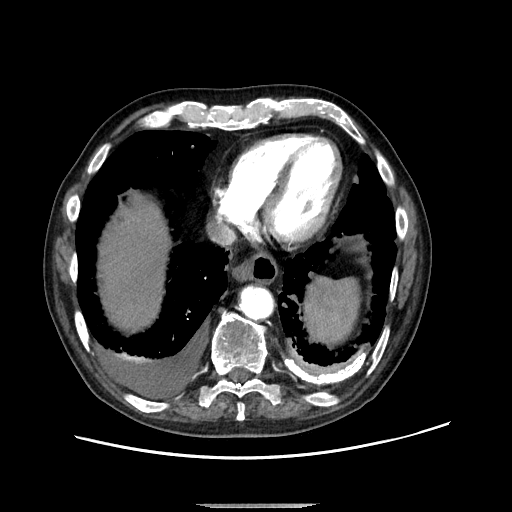
[im 63/138  lung]
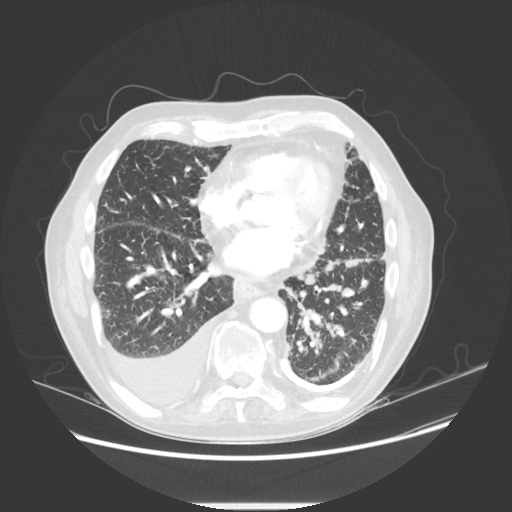
[im 75/138  mediastinal]
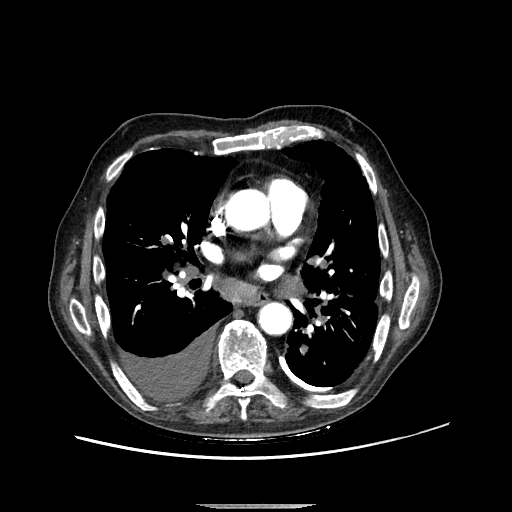
[im 88/138  lung]
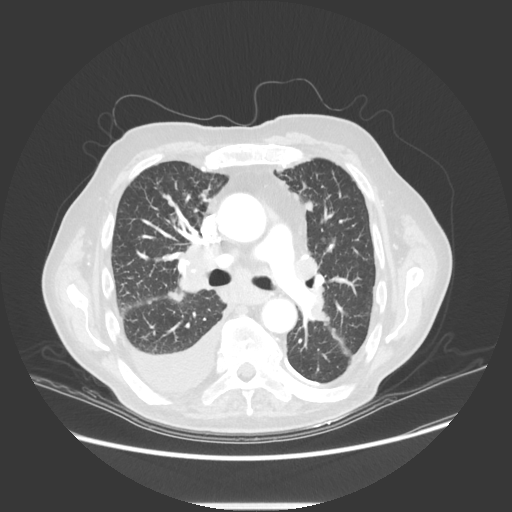
[im 100/138  mediastinal]
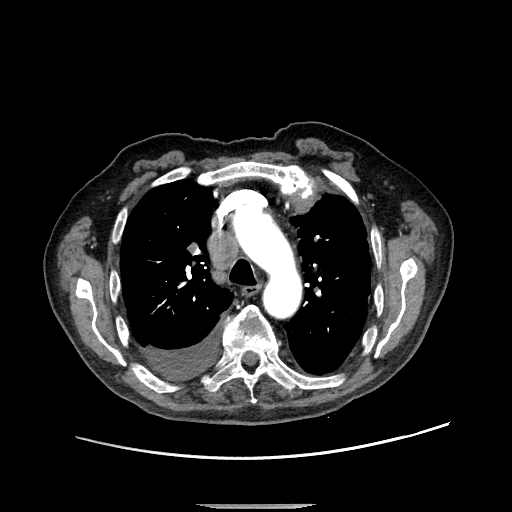
[im 113/138  lung]
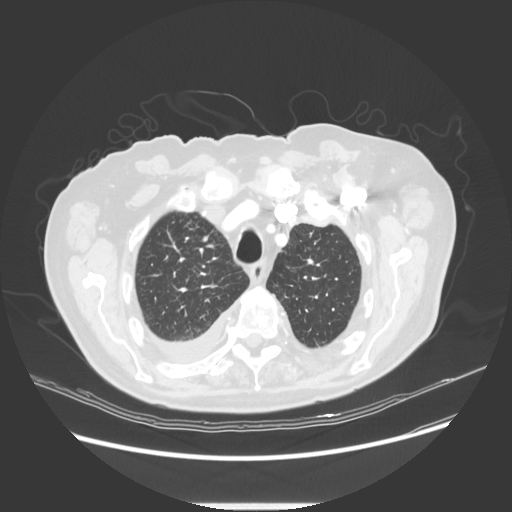
[im 125/138  mediastinal]
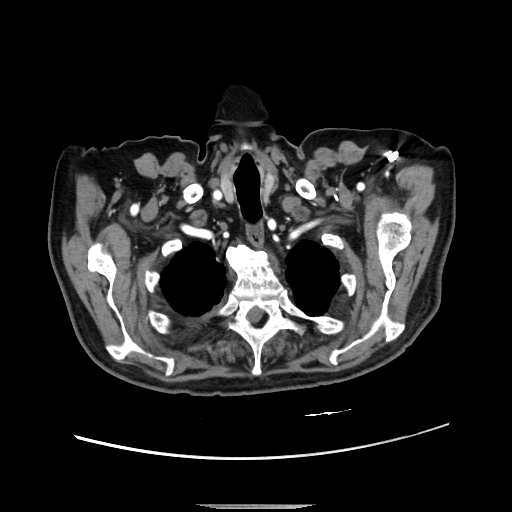

[Series 301: lung · axial · 0.79mm/px · z∈[-287,-100]mm · 6 of 138 slices shown]
[im 13/138  mediastinal]
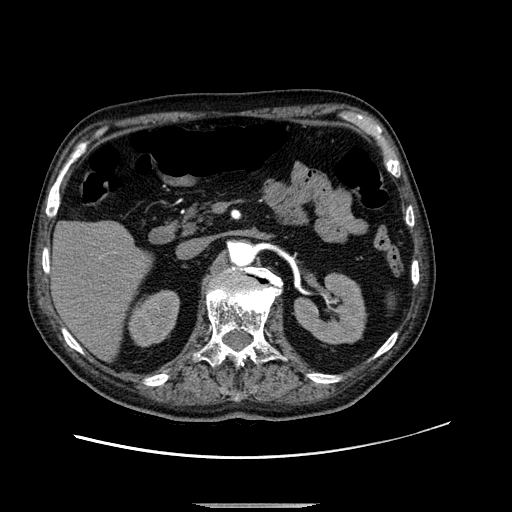
[im 25/138  mediastinal]
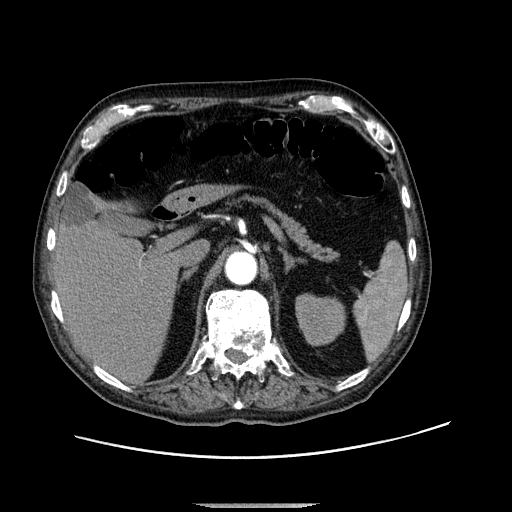
[im 50/138  mediastinal]
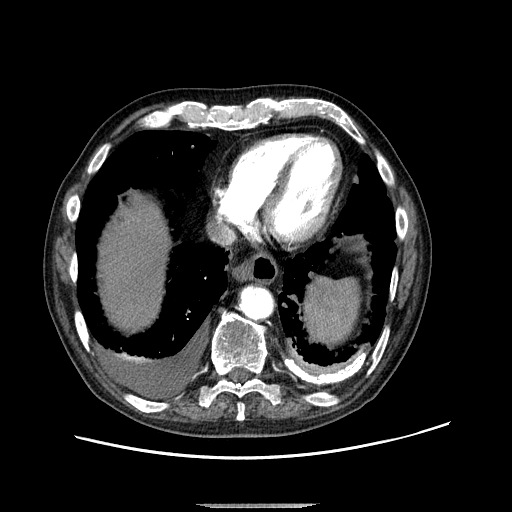
[im 63/138  mediastinal]
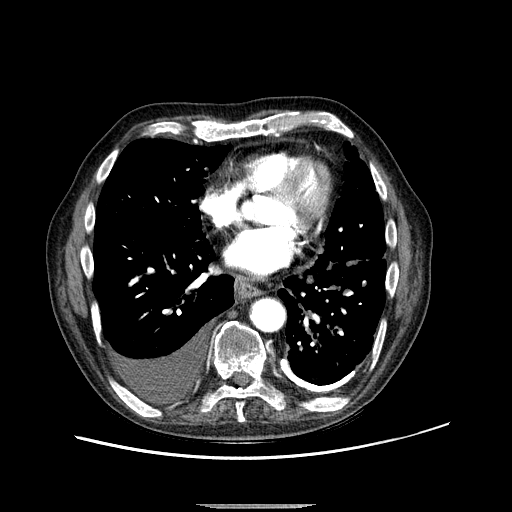
[im 75/138  mediastinal]
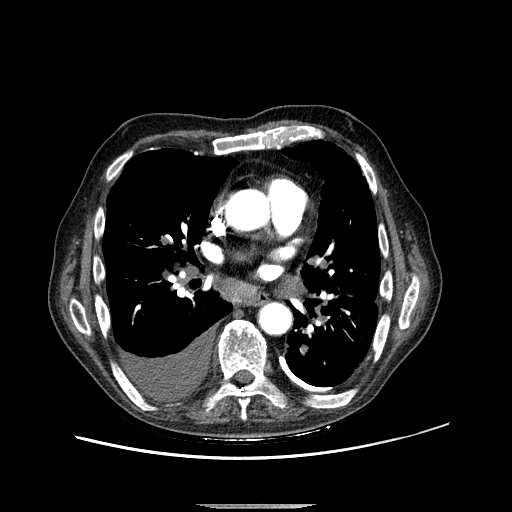
[im 88/138  mediastinal]
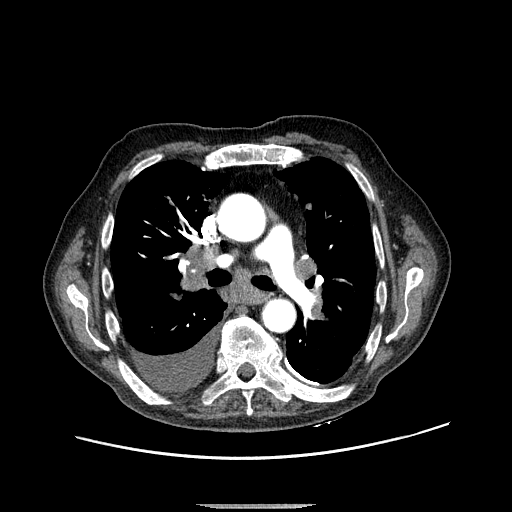

[Series 601: coronal · coronal · 0.79mm/px · 2 of 128 slices shown]
[im 43/128  mediastinal]
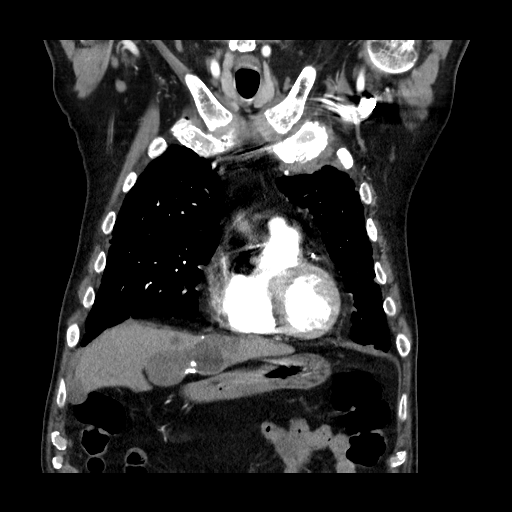
[im 85/128  mediastinal]
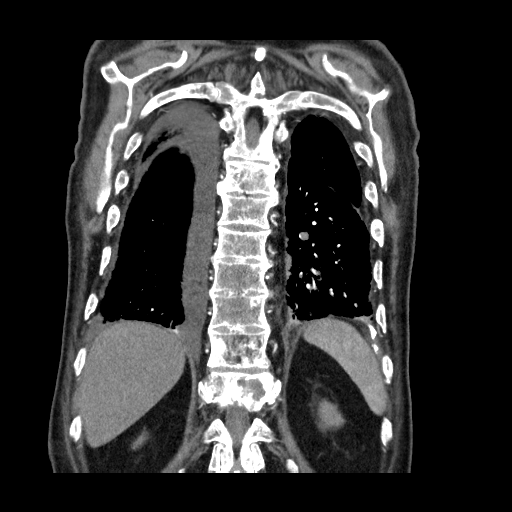

[18 of 36 positions shown; findings below may reference images not displayed]

FINDINGS: Upper abdomen is unremarkable. Gallstones are incidentally noted. Course of the thoracic esophagus is unremarkable. Thyroid is normal. Chest wall structures are normal. No cardiomegaly. Multifocal lymphadenopathy is noted. This is new since the prior. There is bilateral hilar, right paratracheal, and subcarinal adenopathy. Index paratracheal node measures 2.6 cm. there is mild atherosclerosis. There is no filling defect in the central pulmonary arteries. Small to moderate right pleural effusion is noted. Calcified left pleural plaque seen. Numerous tiny nodules are present along the major fissures bilaterally as well as the pleural surfaces. This can represent nodes or metastatic disease. Multiple small nodules are seen particularly at the left lung base. These measure up to 10 mm. Scattered areas of sclerosis in the ribs are nonspecific. There is sclerosis and cortical thickening of the left anterior first rib with surrounding soft tissue density. There are lytic and
sclerotic lesions in the thoracic spine. At T12 lytic lesion causes posterior retropulsion by 2 to 3 mm. Spinal stimulator wires are noted.
IMPRESSION: 
IMPRESSION: 1. No evidence of acute pulmonary embolus
2. Multifocal metastatic disease throughout the chest including mediastinal adenopathy and multiple small lung nodules which are primarily pleural based.
3. New small to moderate right pleural effusion
4. Sclerosis of the left first rib likely metastatic
5. Lytic and sclerotic spinal lesions. Lesion at T12 shows posterior cortical dehiscence and mild encroachment upon the spinal canal. This can be further characterized with MRI with contrast as clinically indicated
